# Patient Record
Sex: Female | Born: 1940
Health system: Southern US, Community
[De-identification: ages and names within clinical notes are randomized; demographics above are authoritative.]

## PROBLEM LIST (undated history)

## (undated) DIAGNOSIS — I10 Essential (primary) hypertension: Secondary | ICD-10-CM

## (undated) DIAGNOSIS — Z87442 Personal history of urinary calculi: Secondary | ICD-10-CM

## (undated) DIAGNOSIS — K635 Polyp of colon: Secondary | ICD-10-CM

## (undated) DIAGNOSIS — E119 Type 2 diabetes mellitus without complications: Secondary | ICD-10-CM

## (undated) DIAGNOSIS — R351 Nocturia: Secondary | ICD-10-CM

## (undated) DIAGNOSIS — M1990 Unspecified osteoarthritis, unspecified site: Secondary | ICD-10-CM

## (undated) HISTORY — PX: CHOLECYSTECTOMY: SHX55

## (undated) HISTORY — PX: ABDOMINAL HYSTERECTOMY: SHX81

## (undated) HISTORY — PX: OTHER SURGICAL HISTORY: SHX169

## (undated) HISTORY — DX: Polyp of colon: K63.5

## (undated) HISTORY — PX: TONSILLECTOMY: SUR1361

## (undated) HISTORY — PX: COLONOSCOPY: SHX174

## (undated) HISTORY — PX: JOINT REPLACEMENT: SHX530

---

## 2004-08-02 ENCOUNTER — Emergency Department: Payer: Self-pay | Admitting: Emergency Medicine

## 2005-11-25 ENCOUNTER — Ambulatory Visit: Payer: Self-pay

## 2005-12-29 ENCOUNTER — Ambulatory Visit: Payer: Self-pay

## 2006-01-06 ENCOUNTER — Emergency Department: Payer: Self-pay | Admitting: Internal Medicine

## 2006-05-11 ENCOUNTER — Ambulatory Visit: Payer: Self-pay | Admitting: General Surgery

## 2006-11-18 ENCOUNTER — Ambulatory Visit: Payer: Self-pay | Admitting: General Surgery

## 2006-12-20 ENCOUNTER — Emergency Department: Payer: Self-pay | Admitting: Emergency Medicine

## 2007-06-16 ENCOUNTER — Ambulatory Visit: Payer: Self-pay | Admitting: General Surgery

## 2007-08-12 DIAGNOSIS — K635 Polyp of colon: Secondary | ICD-10-CM

## 2007-08-12 HISTORY — DX: Polyp of colon: K63.5

## 2008-01-17 ENCOUNTER — Ambulatory Visit: Payer: Self-pay | Admitting: General Surgery

## 2008-08-08 ENCOUNTER — Emergency Department: Payer: Self-pay | Admitting: Internal Medicine

## 2008-12-27 ENCOUNTER — Ambulatory Visit: Payer: Self-pay | Admitting: Family Medicine

## 2009-11-19 ENCOUNTER — Ambulatory Visit: Payer: Self-pay | Admitting: Family Medicine

## 2009-12-29 ENCOUNTER — Emergency Department: Payer: Self-pay | Admitting: Emergency Medicine

## 2009-12-31 ENCOUNTER — Ambulatory Visit: Payer: Self-pay | Admitting: Family Medicine

## 2010-01-16 ENCOUNTER — Ambulatory Visit: Payer: Self-pay | Admitting: Family Medicine

## 2010-08-15 ENCOUNTER — Ambulatory Visit: Payer: Self-pay | Admitting: Family Medicine

## 2010-12-27 ENCOUNTER — Ambulatory Visit: Payer: Self-pay | Admitting: Physical Medicine and Rehabilitation

## 2011-04-30 ENCOUNTER — Ambulatory Visit: Payer: Self-pay | Admitting: Family Medicine

## 2011-05-17 ENCOUNTER — Emergency Department: Payer: Self-pay | Admitting: Emergency Medicine

## 2011-10-03 ENCOUNTER — Emergency Department (HOSPITAL_COMMUNITY)
Admission: EM | Admit: 2011-10-03 | Discharge: 2011-10-03 | Disposition: A | Discharge status: Home Health | Payer: Medicare Other | Attending: Emergency Medicine | Admitting: Emergency Medicine

## 2011-10-03 ENCOUNTER — Encounter (HOSPITAL_COMMUNITY): Payer: Self-pay

## 2011-10-03 ENCOUNTER — Emergency Department (HOSPITAL_COMMUNITY): Payer: Medicare Other

## 2011-10-03 DIAGNOSIS — E119 Type 2 diabetes mellitus without complications: Secondary | ICD-10-CM | POA: Insufficient documentation

## 2011-10-03 DIAGNOSIS — M79609 Pain in unspecified limb: Secondary | ICD-10-CM | POA: Diagnosis not present

## 2011-10-03 DIAGNOSIS — G8929 Other chronic pain: Secondary | ICD-10-CM | POA: Insufficient documentation

## 2011-10-03 DIAGNOSIS — I10 Essential (primary) hypertension: Secondary | ICD-10-CM | POA: Diagnosis not present

## 2011-10-03 DIAGNOSIS — M545 Low back pain, unspecified: Secondary | ICD-10-CM | POA: Insufficient documentation

## 2011-10-03 DIAGNOSIS — M549 Dorsalgia, unspecified: Secondary | ICD-10-CM

## 2011-10-03 DIAGNOSIS — I700 Atherosclerosis of aorta: Secondary | ICD-10-CM | POA: Diagnosis not present

## 2011-10-03 DIAGNOSIS — E7800 Pure hypercholesterolemia, unspecified: Secondary | ICD-10-CM | POA: Diagnosis not present

## 2011-10-03 DIAGNOSIS — M47817 Spondylosis without myelopathy or radiculopathy, lumbosacral region: Secondary | ICD-10-CM | POA: Diagnosis not present

## 2011-10-03 HISTORY — DX: Essential (primary) hypertension: I10

## 2011-10-03 MED ORDER — DIAZEPAM 2 MG PO TABS: 2.0000 mg | ORAL_TABLET | Freq: Two times a day (BID) | ORAL | Status: AC | PRN

## 2011-10-03 MED ORDER — HYDROCODONE-ACETAMINOPHEN 5-325 MG PO TABS: 1.0000 | ORAL_TABLET | Freq: Four times a day (QID) | ORAL | Status: AC | PRN

## 2011-10-03 NOTE — ED Notes (Signed)
Ambulated to the waiting room to await ride from her daughter.

## 2011-10-03 NOTE — ED Notes (Signed)
Patient transported to X-ray 

## 2011-10-03 NOTE — ED Notes (Addendum)
Pt c/o (L) lower back pain x1 day, increase pain w/left leg movement. Pt reports hx of chronic back pain, pt reports last flare up was July 2012. Pt describes her pain as tender and rates her pain 6/10 w/movement and 0/10 w/no movement. Pt denies any injury or numbness to lower extremities.

## 2011-10-03 NOTE — ED Notes (Signed)
MD at bedside. 

## 2011-10-03 NOTE — Discharge Instructions (Signed)
Please see your primary care provider for further management of your chronic back pain.  You may return to the ER at any time for worsening condition or any new symptoms that concern you.    Back Pain, Adult Low back pain is very common. About 1 in 5 people have back pain.The cause of low back pain is rarely dangerous. The pain often gets better over time.About half of people with a sudden onset of back pain feel better in just 2 weeks. About 8 in 10 people feel better by 6 weeks.  CAUSES Some common causes of back pain include:  Strain of the muscles or ligaments supporting the spine.   Wear and tear (degeneration) of the spinal discs.   Arthritis.   Direct injury to the back.  DIAGNOSIS Most of the time, the direct cause of low back pain is not known.However, back pain can be treated effectively even when the exact cause of the pain is unknown.Answering your caregiver's questions about your overall health and symptoms is one of the most accurate ways to make sure the cause of your pain is not dangerous. If your caregiver needs more information, he or she may order lab work or imaging tests (X-rays or MRIs).However, even if imaging tests show changes in your back, this usually does not require surgery. HOME CARE INSTRUCTIONS For many people, back pain returns.Since low back pain is rarely dangerous, it is often a condition that people can learn to [facility] their own.   Remain active. It is stressful on the back to sit or stand in one place. Do not sit, drive, or stand in one place for more than 30 minutes at a time. Take short walks on level surfaces as soon as pain allows.Try to increase the length of time you walk each day.   Do not stay in bed.Resting more than 1 or 2 days can delay your recovery.   Do not avoid exercise or work.Your body is made to move.It is not dangerous to be active, even though your back may hurt.Your back will likely heal faster if you return to being  active before your pain is gone.   Pay attention to your body when you bend and lift. Many people have less discomfortwhen lifting if they bend their knees, keep the load close to their bodies,and avoid twisting. Often, the most comfortable positions are those that put less stress on your recovering back.   Find a comfortable position to sleep. Use a firm mattress and lie on your side with your knees slightly bent. If you lie on your back, put a pillow under your knees.   Only take over-the-counter or prescription medicines as directed by your caregiver. Over-the-counter medicines to reduce pain and inflammation are often the most helpful.Your caregiver may prescribe muscle relaxant drugs.These medicines help dull your pain so you can more quickly return to your normal activities and healthy exercise.   Put ice on the injured area.   Put ice in a plastic bag.   Place a towel between your skin and the bag.   Leave the ice on for 15 to 20 minutes, 3 to 4 times a day for the first 2 to 3 days. After that, ice and heat may be alternated to reduce pain and spasms.   Ask your caregiver about trying back exercises and gentle massage. This may be of some benefit.   Avoid feeling anxious or stressed.Stress increases muscle tension and can worsen back pain.It is important to recognize  when you are anxious or stressed and learn ways to manage it.Exercise is a great option.  SEEK MEDICAL CARE IF:  You have pain that is not relieved with rest or medicine.   You have pain that does not improve in 1 week.   You have new symptoms.   You are generally not feeling well.  SEEK IMMEDIATE MEDICAL CARE IF:   You have pain that radiates from your back into your legs.   You develop new bowel or bladder control problems.   You have unusual weakness or numbness in your arms or legs.   You develop nausea or vomiting.   You develop abdominal pain.   You feel faint.  Document Released: 07/28/2005  Document Revised: 04/09/2011 Document Reviewed: 12/16/2010 [facility] Patient Information 2012 [place], [place].  RESOURCE GUIDE  Dental Problems  Patients with Medicaid: [facility] [phone]                                           [phone] [address] [employer] [address] Phone:  [phone]                                                  Phone:  [phone]  If unable to pay or uninsured, contact:  [facility] or [facility]. to become qualified for the adult dental clinic.  Chronic Pain Problems Contact [facility]  [phone] Patients need to be referred by their primary care doctor.  Insufficient Money for Medicine Contact United Way:  call "211" or [facility] 740-880-0738.  No Primary Care Doctor Call [facility]  [phone] Other agencies that provide inexpensive medical care    [facility]  [phone]    [facility]  [phone]    [facility]  [phone]    [facility]  [phone]    [facility]  [phone]    [facility]  [phone]  Psychological Services [facility]  [phone] [facility] Services  [phone] [facility] Mental Health   [phone] (emergency services ([phone])  Substance Abuse Resources [facility]  (201) [phone] [facility] (786)177-5740 [facility] [place] 602-665-0195 Floydene Flock (701)616-8700 Residential & Outpatient Substance Abuse Program  787-571-5688  Abuse/Neglect [facility] Child Abuse Hotline [phone] [facility] Child Abuse Hotline 203-533-1601 (After Hours)  Emergency Shelter [facility] [phone]  Maternity Homes [facility] at [facility] [place] [facility] [place] (408) [phone] [facility] [phone]  MRSA Hotline #:   (618)606-6453    [facility] Resources  [facility] [place]     United Way                           [facility] Dept. [address] [place]                       [address]      [address]  [address]  Sela Hua Phone:  [phone]                                   Phone:  [phone]                 Phone:  [place] Phone:  [place] [phone] (After Hours)

## 2011-10-03 NOTE — ED Provider Notes (Signed)
Medical screening examination/treatment/procedure(s) were conducted as a shared visit with non-physician practitioner(s) and myself.  I personally evaluated the patient during the encounter  Doug Sou, MD 10/03/11 1737

## 2011-10-03 NOTE — ED Provider Notes (Signed)
History     CSN: [id number]  Arrival date & time 10/03/11  4098   First MD Initiated Contact with Patient 10/03/11 [phone]      Chief Complaint  Patient presents with  . Back Pain    (Consider location/radiation/quality/duration/timing/severity/associated sxs/prior treatment) HPI Comments: The patient presents with back pain that started yesterday. She reports a gradual onset, and recalls she "wasn't doing anything" when the pain started. She reports the pain as localized to the left lower back. She says the pain is intermittent, achy and sometimes shoots down her left leg. The patient reports having a history of "back problems" since 2004 although she denies any past or recent trauma. She states the pain is exacerbated by ambulation, and she has not taken anything for the pain since it started yesterday. She denies any numbness or tingling, bowel/bladder incontinence, lower extremity weakness, or fever.   Patient is a 71 y.o. female presenting with back pain. The history is provided by the patient.  Back Pain  Pertinent negatives include no fever, no numbness, no abdominal pain, no dysuria and no weakness.    Past Medical History  Diagnosis Date  . Diabetes mellitus   . Hypertension   . High cholesterol     History reviewed. No pertinent past surgical history.  History reviewed. No pertinent family history.  History  Substance Use Topics  . Smoking status: Former Games developer  . Smokeless tobacco: Not on file  . Alcohol Use: No    OB History    Grav Para Term Preterm Abortions TAB SAB Ect Mult Living                  Review of Systems  Constitutional: Negative for fever.  Gastrointestinal: Negative for abdominal pain, diarrhea and constipation.  Genitourinary: Negative for dysuria, frequency and difficulty urinating.  Musculoskeletal: Positive for back pain.  Neurological: Negative for weakness and numbness.  All other systems reviewed and are negative.    Allergies    Review of patient's allergies indicates no known allergies.  Home Medications  No current outpatient prescriptions on file.  BP 145/89  Pulse 55  Temp(Src) 98 F (36.7 C) (Oral)  Resp 18  Ht 5\' 8"  (1.727 m)  Wt 259 lb (117.482 kg)  BMI 39.38 kg/m2  SpO2 99%  Physical Exam  Nursing note and vitals reviewed. Constitutional: She is oriented to person, place, and time. She appears well-developed and well-nourished.  HENT:  Head: Normocephalic and atraumatic.  Neck: Neck supple.  Cardiovascular: Normal rate, regular rhythm and normal heart sounds.   Pulmonary/Chest: Breath sounds normal. No respiratory distress. She has no wheezes. She has no rales. She exhibits no tenderness.  Abdominal: Soft. Bowel sounds are normal. There is no tenderness.  Musculoskeletal: Normal range of motion. She exhibits no edema and no tenderness.       Cervical back: She exhibits no tenderness and no bony tenderness.       Thoracic back: She exhibits no tenderness and no bony tenderness.       Lumbar back: She exhibits no tenderness and no bony tenderness.       Back:       Lower extremities: strength 5/5, sensation intact, distal pulses intact.   Neurological: She is alert and oriented to person, place, and time.  Psychiatric: She has a normal mood and affect.    ED Course  Procedures (including critical care time)  Labs Reviewed - No data to display Dg Lumbar Spine Complete  10/03/2011  *RADIOLOGY REPORT*  Clinical Data: No injury.  Chronic back pain.  LUMBAR SPINE - COMPLETE 4+ VIEW  Comparison: None.  Findings: Small to moderate Schmorl's node deformity superior plate L4 with anterior osteophyte.  Facet joint degenerative changes most notable L5-S1.  Aorta is partially calcified.  Caliber of the aorta incompletely assessed.  IMPRESSION: Small to moderate Schmorl's node deformity superior plate L4 with anterior osteophyte.  Facet joint degenerative changes most notable L5-S1.  Aorta is partially  calcified.  Caliber of the aorta incompletely assessed.  Original Report Authenticated By: Fuller Canada, M.D.   Patient discussed with Dr Ethelda Chick who has also seen the patient.   Patient is ambulatory without assistance.   1. Chronic back pain       MDM  Patient with exacerbation of chronic back pain without injury.  Xray showing schmorl's node and degenerative changes.  No neurological deficits.  Patient d/c home with pain medications, PCP (in [place]) follow up.  Patient verbalizes understanding and agrees with plan.          Dillard Cannon [place], [place] 10/03/11 [phone]

## 2011-10-03 NOTE — ED Provider Notes (Signed)
Complains of low back pain left sided paralumbar nonradiating for the past several days pain is worse with movement or changing position. Has had similar pain for several years. No treatment prior to coming here. On exam alert nontoxic abdomen obese nontender back no tenderness over spine paralumbar pain is exacerbated when patient stands up from a supine position gait is normal Pain felt to be musculoskeletal in etiology  Doug Sou, MD 10/03/11 1108

## 2011-10-03 NOTE — ED Notes (Signed)
Lower back pain denies any injury, denies any numbness or tingling of extremeties

## 2011-11-21 DIAGNOSIS — I10 Essential (primary) hypertension: Secondary | ICD-10-CM | POA: Diagnosis not present

## 2011-11-21 DIAGNOSIS — E119 Type 2 diabetes mellitus without complications: Secondary | ICD-10-CM | POA: Diagnosis not present

## 2011-11-21 DIAGNOSIS — E7800 Pure hypercholesterolemia, unspecified: Secondary | ICD-10-CM | POA: Diagnosis not present

## 2011-11-21 DIAGNOSIS — Z23 Encounter for immunization: Secondary | ICD-10-CM | POA: Diagnosis not present

## 2012-03-24 DIAGNOSIS — Z23 Encounter for immunization: Secondary | ICD-10-CM | POA: Diagnosis not present

## 2012-03-24 DIAGNOSIS — E119 Type 2 diabetes mellitus without complications: Secondary | ICD-10-CM | POA: Diagnosis not present

## 2012-03-24 DIAGNOSIS — Z0000 Encounter for general adult medical examination without abnormal findings: Secondary | ICD-10-CM | POA: Diagnosis not present

## 2012-03-24 DIAGNOSIS — I10 Essential (primary) hypertension: Secondary | ICD-10-CM | POA: Diagnosis not present

## 2012-03-24 DIAGNOSIS — E7800 Pure hypercholesterolemia, unspecified: Secondary | ICD-10-CM | POA: Diagnosis not present

## 2012-03-30 ENCOUNTER — Ambulatory Visit: Payer: Self-pay | Admitting: Family Medicine

## 2012-03-30 DIAGNOSIS — N951 Menopausal and female climacteric states: Secondary | ICD-10-CM | POA: Diagnosis not present

## 2012-03-30 LAB — HM DEXA SCAN: HM Dexa Scan: NORMAL

## 2012-05-04 DIAGNOSIS — E11329 Type 2 diabetes mellitus with mild nonproliferative diabetic retinopathy without macular edema: Secondary | ICD-10-CM | POA: Diagnosis not present

## 2012-05-04 DIAGNOSIS — IMO0001 Reserved for inherently not codable concepts without codable children: Secondary | ICD-10-CM | POA: Diagnosis not present

## 2012-07-21 DIAGNOSIS — Z23 Encounter for immunization: Secondary | ICD-10-CM | POA: Diagnosis not present

## 2012-07-21 DIAGNOSIS — E7800 Pure hypercholesterolemia, unspecified: Secondary | ICD-10-CM | POA: Diagnosis not present

## 2012-07-21 DIAGNOSIS — M545 Low back pain, unspecified: Secondary | ICD-10-CM | POA: Diagnosis not present

## 2012-07-21 DIAGNOSIS — E119 Type 2 diabetes mellitus without complications: Secondary | ICD-10-CM | POA: Diagnosis not present

## 2012-12-24 DIAGNOSIS — I10 Essential (primary) hypertension: Secondary | ICD-10-CM | POA: Diagnosis not present

## 2012-12-24 DIAGNOSIS — E119 Type 2 diabetes mellitus without complications: Secondary | ICD-10-CM | POA: Diagnosis not present

## 2012-12-24 DIAGNOSIS — Z23 Encounter for immunization: Secondary | ICD-10-CM | POA: Diagnosis not present

## 2012-12-24 DIAGNOSIS — E7800 Pure hypercholesterolemia, unspecified: Secondary | ICD-10-CM | POA: Diagnosis not present

## 2013-01-19 ENCOUNTER — Encounter: Payer: Self-pay | Admitting: General Surgery

## 2013-01-19 ENCOUNTER — Ambulatory Visit: Payer: Self-pay | Admitting: Family Medicine

## 2013-01-19 DIAGNOSIS — Z1231 Encounter for screening mammogram for malignant neoplasm of breast: Secondary | ICD-10-CM | POA: Diagnosis not present

## 2013-01-25 ENCOUNTER — Observation Stay (HOSPITAL_COMMUNITY)
Admission: EM | Admit: 2013-01-25 | Discharge: 2013-01-26 | Disposition: A | Discharge status: Home / Self Care | Payer: Medicare Other | Attending: Internal Medicine | Admitting: Internal Medicine

## 2013-01-25 ENCOUNTER — Encounter (HOSPITAL_COMMUNITY): Payer: Self-pay | Admitting: Cardiology

## 2013-01-25 ENCOUNTER — Emergency Department (HOSPITAL_COMMUNITY): Payer: Medicare Other

## 2013-01-25 DIAGNOSIS — F17200 Nicotine dependence, unspecified, uncomplicated: Secondary | ICD-10-CM | POA: Diagnosis not present

## 2013-01-25 DIAGNOSIS — R001 Bradycardia, unspecified: Secondary | ICD-10-CM | POA: Diagnosis present

## 2013-01-25 DIAGNOSIS — Z720 Tobacco use: Secondary | ICD-10-CM

## 2013-01-25 DIAGNOSIS — Z79899 Other long term (current) drug therapy: Secondary | ICD-10-CM | POA: Insufficient documentation

## 2013-01-25 DIAGNOSIS — G459 Transient cerebral ischemic attack, unspecified: Principal | ICD-10-CM | POA: Insufficient documentation

## 2013-01-25 DIAGNOSIS — N390 Urinary tract infection, site not specified: Secondary | ICD-10-CM | POA: Insufficient documentation

## 2013-01-25 DIAGNOSIS — R42 Dizziness and giddiness: Secondary | ICD-10-CM | POA: Diagnosis not present

## 2013-01-25 DIAGNOSIS — H538 Other visual disturbances: Secondary | ICD-10-CM | POA: Diagnosis not present

## 2013-01-25 DIAGNOSIS — E119 Type 2 diabetes mellitus without complications: Secondary | ICD-10-CM | POA: Insufficient documentation

## 2013-01-25 DIAGNOSIS — I498 Other specified cardiac arrhythmias: Secondary | ICD-10-CM | POA: Insufficient documentation

## 2013-01-25 DIAGNOSIS — I10 Essential (primary) hypertension: Secondary | ICD-10-CM | POA: Diagnosis not present

## 2013-01-25 DIAGNOSIS — N179 Acute kidney failure, unspecified: Secondary | ICD-10-CM | POA: Diagnosis not present

## 2013-01-25 DIAGNOSIS — S0990XA Unspecified injury of head, initial encounter: Secondary | ICD-10-CM | POA: Diagnosis not present

## 2013-01-25 DIAGNOSIS — F17201 Nicotine dependence, unspecified, in remission: Secondary | ICD-10-CM | POA: Diagnosis present

## 2013-01-25 DIAGNOSIS — Z8673 Personal history of transient ischemic attack (TIA), and cerebral infarction without residual deficits: Secondary | ICD-10-CM | POA: Diagnosis present

## 2013-01-25 LAB — POCT I-STAT, CHEM 8
BUN: 15 mg/dL (ref 6–23)
Calcium, Ion: 1.25 mmol/L (ref 1.13–1.30)
Creatinine, Ser: 1.4 mg/dL — ABNORMAL HIGH (ref 0.50–1.10)
Hemoglobin: 13.9 g/dL (ref 12.0–15.0)
Sodium: 146 mEq/L — ABNORMAL HIGH (ref 135–145)
TCO2: 27 mmol/L (ref 0–100)

## 2013-01-25 LAB — DIFFERENTIAL
Basophils Relative: 1 % (ref 0–1)
Eosinophils Absolute: 0.2 10*3/uL (ref 0.0–0.7)
Monocytes Absolute: 0.6 10*3/uL (ref 0.1–1.0)
Monocytes Relative: 9 % (ref 3–12)
Neutrophils Relative %: 47 % (ref 43–77)

## 2013-01-25 LAB — COMPREHENSIVE METABOLIC PANEL
Albumin: 3.7 g/dL (ref 3.5–5.2)
BUN: 15 mg/dL (ref 6–23)
Calcium: 10 mg/dL (ref 8.4–10.5)
Creatinine, Ser: 1.24 mg/dL — ABNORMAL HIGH (ref 0.50–1.10)
Total Protein: 7 g/dL (ref 6.0–8.3)

## 2013-01-25 LAB — RAPID URINE DRUG SCREEN, HOSP PERFORMED
Amphetamines: NOT DETECTED
Benzodiazepines: NOT DETECTED
Opiates: NOT DETECTED

## 2013-01-25 LAB — GLUCOSE, CAPILLARY: Glucose-Capillary: 119 mg/dL — ABNORMAL HIGH (ref 70–99)

## 2013-01-25 LAB — URINALYSIS, ROUTINE W REFLEX MICROSCOPIC
Glucose, UA: NEGATIVE mg/dL
Hgb urine dipstick: NEGATIVE
Ketones, ur: NEGATIVE mg/dL
Protein, ur: NEGATIVE mg/dL

## 2013-01-25 LAB — TROPONIN I: Troponin I: <0.3 ng/mL (ref <0.30)

## 2013-01-25 LAB — URINE MICROSCOPIC-ADD ON

## 2013-01-25 LAB — CBC
HCT: 41.3 % (ref 36.0–46.0)
Hemoglobin: 13.7 g/dL (ref 12.0–15.0)
MCH: 31.6 pg (ref 26.0–34.0)
MCHC: 33.2 g/dL (ref 30.0–36.0)

## 2013-01-25 LAB — PROTIME-INR: INR: 0.98 (ref 0.00–1.49)

## 2013-01-25 LAB — POCT I-STAT TROPONIN I

## 2013-01-25 MED ORDER — ASPIRIN 81 MG PO CHEW
324.0000 mg | CHEWABLE_TABLET | Freq: Once | ORAL | Status: AC
  Administered 2013-01-25: 324 mg via ORAL
  Filled 2013-01-25: qty 4

## 2013-01-25 MED ORDER — SODIUM CHLORIDE 0.9 % IV SOLN
INTRAVENOUS | Status: AC
  Administered 2013-01-25: 23:00:00 via INTRAVENOUS

## 2013-01-25 MED ORDER — ONDANSETRON HCL 4 MG/2ML IJ SOLN
4.0000 mg | Freq: Three times a day (TID) | INTRAMUSCULAR | Status: AC | PRN
  Administered 2013-01-25: 4 mg via INTRAVENOUS
  Filled 2013-01-25: qty 2

## 2013-01-25 MED ORDER — ACETAMINOPHEN 325 MG PO TABS: 650.0000 mg | ORAL_TABLET | ORAL | Status: DC | PRN

## 2013-01-25 MED ORDER — INSULIN ASPART 100 UNIT/ML ~~LOC~~ SOLN: 0.0000 [IU] | Freq: Every day | SUBCUTANEOUS | Status: DC

## 2013-01-25 MED ORDER — SIMVASTATIN 10 MG PO TABS
10.0000 mg | ORAL_TABLET | Freq: Every day | ORAL | Status: DC
  Filled 2013-01-25: qty 1

## 2013-01-25 MED ORDER — ASPIRIN 325 MG PO TABS
325.0000 mg | ORAL_TABLET | Freq: Every day | ORAL | Status: DC
  Administered 2013-01-26: 325 mg via ORAL
  Filled 2013-01-25 (×2): qty 1

## 2013-01-25 MED ORDER — DEXTROSE 5 % IV SOLN
1.0000 g | INTRAVENOUS | Status: DC
  Administered 2013-01-25: 1 g via INTRAVENOUS
  Filled 2013-01-25 (×2): qty 10

## 2013-01-25 MED ORDER — INSULIN ASPART 100 UNIT/ML ~~LOC~~ SOLN: 0.0000 [IU] | Freq: Three times a day (TID) | SUBCUTANEOUS | Status: DC

## 2013-01-25 NOTE — Progress Notes (Signed)
72 yr old F presents after episode of vision change and dizziness that is now resolved. Hypertensive in the 180's  admiited to tele for TIA workup

## 2013-01-25 NOTE — ED Provider Notes (Signed)
History     CSN: [id number]  Arrival date & time 01/25/13  1634   First MD Initiated Contact with Patient 01/25/13 1651      Chief Complaint  Patient presents with  . Dizziness    (Consider location/radiation/quality/duration/timing/severity/associated sxs/prior treatment) HPI Comments: Patient presents after episode of vision change and dizziness that is now resolved. He states he was getting in the car when she noticed her vision became bright and blurry and she was "unable to keep up with her feet". Vertigo in the past and states this is different. Her vision is back to normal now. She denies any focal weakness, numbness or tingling. Denies vertigo now.  C/o gradual onset headache since episode of dizziness and vision change.  Episode lasted 2-3 minutes. Feels back to baseline now. Denies chest pain or shortness of breath. She's a history of hypertension, high cholesterol diabetes. Denies any difficulty swallowing or talking.  The history is provided by the patient.    Past Medical History  Diagnosis Date  . Hypertension   . High cholesterol   . Diabetes mellitus     Past Surgical History  Procedure Laterality Date  . Abdominal hysterectomy    . Cholecystectomy    . Joint replacement Left     knee  . Goiter removal      Family History  Problem Relation Age of Onset  . Heart disease Mother   . Diabetes Mother   . Cancer Father     History  Substance Use Topics  . Smoking status: Current Every Day Smoker -- 0.50 packs/day    Types: Cigarettes  . Smokeless tobacco: Never Used  . Alcohol Use: No    OB History   Grav Para Term Preterm Abortions TAB SAB Ect Mult Living                  Review of Systems  Constitutional: Negative for fever, activity change and appetite change.  HENT: Negative for congestion, rhinorrhea and neck pain.   Eyes: Positive for visual disturbance.  Respiratory: Negative for cough, chest tightness and shortness of breath.    Cardiovascular: Negative for chest pain.  Gastrointestinal: Negative for nausea, vomiting and abdominal pain.  Genitourinary: Negative for dysuria, vaginal bleeding and vaginal discharge.  Musculoskeletal: Negative for back pain.  Skin: Negative for rash.  Neurological: Positive for dizziness, weakness, light-headedness and headaches. Negative for speech difficulty and numbness.  A complete 10 system review of systems was obtained and all systems are negative except as noted in the HPI and PMH.    Allergies  Review of patient's allergies indicates no known allergies.  Home Medications   No current outpatient prescriptions on file.  BP 136/84  Pulse 48  Temp(Src) 98 F (36.7 C) (Oral)  Resp 17  SpO2 100%  Physical Exam  Constitutional: She is oriented to person, place, and time. She appears well-developed and well-nourished. No distress.  HENT:  Head: Normocephalic and atraumatic.  Mouth/Throat: Oropharynx is clear and moist. No oropharyngeal exudate.  Eyes: Conjunctivae and EOM are normal. Pupils are equal, round, and reactive to light.  Neck: Normal range of motion. Neck supple.  Cardiovascular: Normal rate, regular rhythm and normal heart sounds.   No murmur heard. Pulmonary/Chest: Effort normal and breath sounds normal. No respiratory distress.  Abdominal: Soft. There is no tenderness. There is no rebound and no guarding.  Musculoskeletal: Normal range of motion. She exhibits no edema and no tenderness.  Neurological: She is alert and oriented  to person, place, and time. No cranial nerve deficit. She exhibits normal muscle tone. Coordination normal.  CN 2-12 intact, no ataxia on finger to nose, no nystagmus, 5/5 strength throughout, no pronator drift, Romberg negative, normal gait.   Skin: Skin is warm.    ED Course  Procedures (including critical care time)  Labs Reviewed  COMPREHENSIVE METABOLIC PANEL - Abnormal; Notable for the following:    Glucose, Bld 105 (*)     Creatinine, Ser 1.24 (*)    GFR calc non Af Amer 42 (*)    GFR calc Af Amer 49 (*)    All other components within normal limits  URINALYSIS, ROUTINE W REFLEX MICROSCOPIC - Abnormal; Notable for the following:    Leukocytes, UA MODERATE (*)    All other components within normal limits  GLUCOSE, CAPILLARY - Abnormal; Notable for the following:    Glucose-Capillary 119 (*)    All other components within normal limits  URINE MICROSCOPIC-ADD ON - Abnormal; Notable for the following:    Squamous Epithelial / LPF FEW (*)    Bacteria, UA FEW (*)    Casts HYALINE CASTS (*)    All other components within normal limits  GLUCOSE, CAPILLARY - Abnormal; Notable for the following:    Glucose-Capillary 103 (*)    All other components within normal limits  POCT I-STAT, CHEM 8 - Abnormal; Notable for the following:    Sodium 146 (*)    Creatinine, Ser 1.40 (*)    Glucose, Bld 101 (*)    All other components within normal limits  URINE CULTURE  ETHANOL  PROTIME-INR  APTT  CBC  DIFFERENTIAL  TROPONIN I  URINE RAPID DRUG SCREEN (HOSP PERFORMED)  HEMOGLOBIN A1C  LIPID PANEL  LIPID PANEL  SODIUM, URINE, RANDOM  CREATININE, URINE, RANDOM  TROPONIN I  TROPONIN I  TROPONIN I  POCT I-STAT TROPONIN I   Ct Head Wo Contrast  01/25/2013   *RADIOLOGY REPORT*  Clinical Data: Fall, dizziness  CT HEAD WITHOUT CONTRAST  Technique:  Contiguous axial images were obtained from the base of the skull through the vertex without contrast.  Comparison: None.  Findings: No acute intracranial hemorrhage.  No focal mass lesion. No CT evidence of acute infarction.   No midline shift or mass effect.  No hydrocephalus.  Basilar cisterns are patent. Paranasal sinuses and mastoid air cells are clear.  Orbits are normal.  IMPRESSION: No acute intracranial findings.   Original Report Authenticated By: Genevive Bi, M.D.     1. TIA (transient ischemic attack)   2. Acute renal failure   3. DM type 2 (diabetes  mellitus, type 2)   4. HTN (hypertension)       MDM  Episode of vision change and dizziness that is now resolved. Nonfocal neuro exam. Concern for TIA.  Nonfocal neuro exam on arrival. No focal deficits. CT head negative for acute infarct.  Second episode of vision change with dizziness and CT scan result after a few minutes. Denies vertigo.  Concern for TIA. Patient with risk factors of diabetes, hypertension, tobacco abuse, cholesterol. Will admit for workup.   Date: 01/25/2013  Rate: 58  Rhythm: normal sinus rhythm  QRS Axis: normal  Intervals: PR prolonged  ST/T Wave abnormalities: normal  Conduction Disutrbances:first-degree A-V block   Narrative Interpretation:   Old EKG Reviewed: none available         Glynn Octave, MD 01/25/13 2243

## 2013-01-25 NOTE — H&P (Signed)
PCP: MALONEY,NANCY, MD    Chief Complaint:  Blurred vision  HPI: Suzanne Faulkner is a 72 y.o. female   has a past medical history of Hypertension; High cholesterol; and Diabetes mellitus.   Presented with  At 4 Pm Patient reports an episode of "abnormal vision" everything went bright and suddenly everything looked like it was further away then it was this lasted less than 5 min. She also reports not feeling well but could not explain in what way. She developed a headache in her temples bilaterally after that. Once she arrived to ER she has an other very short lived episode after which her headache has resolved. Denies slurred speech, denies syncope or vertigo or presyncope.  Patient has risk factors of DM, HTN and tobacco abuse. Hospitalist was called for an admission. Now her symptoms have completely resolved.  denies any urinary complaints although UA showed mild UTI.  States she ahs been bradycardic for the past 10 year and at first she was told she needs a pacemaker  but then since she was not symptomatic this was no longer discussed.   Review of Systems:    Pertinent positives include: headaches, fatigue,  Constitutional:  No weight loss, night sweats, Fevers, chills,  weight loss  HEENT:  No Difficulty swallowing,Tooth/dental problems,Sore throat,  No sneezing, itching, ear ache, nasal congestion, post nasal drip,  Cardio-vascular:  No chest pain, Orthopnea, PND, anasarca, dizziness, palpitations.no Bilateral lower extremity swelling  GI:  No heartburn, indigestion, abdominal pain, nausea, vomiting, diarrhea, change in bowel habits, loss of appetite, melena, blood in stool, hematemesis Resp:  no shortness of breath at rest. No dyspnea on exertion, No excess mucus, no productive cough, No non-productive cough, No coughing up of blood.No change in color of mucus.No wheezing. Skin:  no rash or lesions. No jaundice GU:  no dysuria, change in color of urine, no urgency or frequency.  No straining to urinate.  No flank pain.  Musculoskeletal:  No joint pain or no joint swelling. No decreased range of motion. No back pain.  Psych:  No change in mood or affect. No depression or anxiety. No memory loss.  Neuro: no localizing neurological complaints, no tingling, no weakness, no double vision, no gait abnormality, no slurred speech, no confusion  Otherwise ROS are negative except for above, 10 systems were reviewed  Past Medical History: Past Medical History  Diagnosis Date  . Hypertension   . High cholesterol   . Diabetes mellitus    Past Surgical History  Procedure Laterality Date  . Abdominal hysterectomy    . Cholecystectomy    . Joint replacement Left     knee  . Goiter removal       Medications: Prior to Admission medications   Medication Sig Start Date End Date Taking? Authorizing Provider  Ascorbic Acid (VITAMIN C PO) Take 1 tablet by mouth every morning.   Yes Historical Provider, MD  aspirin EC 81 MG tablet Take 81 mg by mouth every morning.    Yes Historical Provider, MD  b complex vitamins tablet Take 1 tablet by mouth every morning.    Yes Historical Provider, MD  benazepril (LOTENSIN) 20 MG tablet Take 20 mg by mouth every morning.    Yes Historical Provider, MD  Calcium Carbonate-Vitamin D (CALCIUM + D PO) Take 1 tablet by mouth every morning.    Yes Historical Provider, MD  cholecalciferol (VITAMIN D) 1000 UNITS tablet Take 1,000 Units by mouth every morning.    Yes Historical Provider,  MD  hydrochlorothiazide (MICROZIDE) 12.5 MG capsule Take 12.5 mg by mouth every morning.    Yes Historical Provider, MD  lovastatin (MEVACOR) 20 MG tablet Take 20 mg by mouth at bedtime.   Yes Historical Provider, MD  metFORMIN (GLUCOPHAGE) 500 MG tablet Take 500 mg by mouth 2 (two) times daily with a meal.   Yes Historical Provider, MD    Allergies:  No Known Allergies  Social History:  Ambulatory  independently   Lives at   Home with family   reports  that she has been smoking Cigarettes.  She has been smoking about 0.50 packs per day. She has never used smokeless tobacco. She reports that she does not drink alcohol or use illicit drugs.   Family History: family history includes Cancer in her father; Diabetes in her mother; and Heart disease in her mother.    Physical Exam: Patient Vitals for the past 24 hrs:  BP Temp Temp src Pulse Resp SpO2  01/25/13 1918 136/84 mmHg 98 F (36.7 C) Oral 48 17 100 %  01/25/13 1749 - 98 F (36.7 C) - - - -  01/25/13 1649 183/96 mmHg 98.2 F (36.8 C) Oral 53 14 98 %  01/25/13 1638 163/86 mmHg 98.4 F (36.9 C) Oral 58 16 98 %    1. General:  in No Acute distress 2. Psychological: Alert and  Oriented 3. Head/ENT:   Moist  Mucous Membranes                          Head Non traumatic, neck supple                          Normal   Dentition 4. SKIN: normal  Skin turgor,  Skin clean Dry and intact no rash 5. Heart: Regular rate and rhythm no Murmur, Rub or gallop 6. Lungs: Clear to auscultation bilaterally, no wheezes or crackles   7. Abdomen: Soft, non-tender, Non distended, obese 8. Lower extremities: no clubbing, cyanosis, or edema 9. Neurologically strength 5/5 in all 4 ext CN 2-12 intact 10. MSK: Normal range of motion  body mass index is unknown because there is no weight on file.   Labs on Admission:   Recent Labs  01/25/13 1738 01/25/13 1811  NA 145 146*  K 4.4 4.4  CL 110 109  CO2 28  --   GLUCOSE 105* 101*  BUN 15 15  CREATININE 1.24* 1.40*  CALCIUM 10.0  --     Recent Labs  01/25/13 1738  AST 21  ALT 9  ALKPHOS 91  BILITOT 0.3  PROT 7.0  ALBUMIN 3.7   No results found for this basename: LIPASE, AMYLASE,  in the last 72 hours  Recent Labs  01/25/13 1738 01/25/13 1811  WBC 6.4  --   NEUTROABS 3.0  --   HGB 13.7 13.9  HCT 41.3 41.0  MCV 95.2  --   PLT 194  --     Recent Labs  01/25/13 1739  TROPONINI <0.30   No results found for this basename: TSH,  T4TOTAL, FREET3, T3FREE, THYROIDAB,  in the last 72 hours No results found for this basename: VITAMINB12, FOLATE, FERRITIN, TIBC, IRON, RETICCTPCT,  in the last 72 hours No results found for this basename: HGBA1C    The CrCl is unknown because both a height and weight (above a minimum accepted value) are required for this calculation. ABG  Component Value Date/Time   TCO2 27 01/25/2013 1811     No results found for this basename: DDIMER     Other results:  I have pearsonaly reviewed this: ECG REPORT  Rate: 58  Rhythm: Sinus brady ST&T Change: no ischemic changes  UA  7-10 WBC few bacteria   Cultures: No results found for this basename: sdes, specrequest, cult, reptstatus       Radiological Exams on Admission: Ct Head Wo Contrast  01/25/2013   *RADIOLOGY REPORT*  Clinical Data: Fall, dizziness  CT HEAD WITHOUT CONTRAST  Technique:  Contiguous axial images were obtained from the base of the skull through the vertex without contrast.  Comparison: None.  Findings: No acute intracranial hemorrhage.  No focal mass lesion. No CT evidence of acute infarction.   No midline shift or mass effect.  No hydrocephalus.  Basilar cisterns are patent. Paranasal sinuses and mastoid air cells are clear.  Orbits are normal.  IMPRESSION: No acute intracranial findings.   Original Report Authenticated By: Genevive Bi, M.D.    Chart has been reviewed  Assessment/Plan  72 yo F with hx of DM, HTN here with possible TIA symptoms  Present on Admission:  . TIA (transient ischemic attack)  - will admit based on TIA/CVA protocol, await results of MRA/MRI, Carotid Doppler and Echo, obtain cardiac enzymes,  ECG,  Lipid panel, TSH. Order PT/OT evaluation. Will make sure patient is on antiplatelet agent.   Neurology consult if evidence of CVA on MRI.     Marland Kitchen HTN (hypertension) - hold lisinopril given possible Acute renal falilure . Tobacco abuse - spoke about quiting . DM type 2 (diabetes mellitus,  type 2) - SSI hold metformin . Bradycardia - chronic . Acute renal failure - give gentle IVF unsure what is her baseline, FeNa, urine electrolytes   Prophylaxis: SCD    CODE STATUS: FULL CODE  Other plan as per orders.  I have spent a total of 55 min on this admission  Clarann Helvey 01/25/2013, 7:48 PM

## 2013-01-25 NOTE — ED Notes (Addendum)
Pt reports getting out of her car this afternoon and getting dizzy. States that she had blurry vision and dizziness that lasted a couple of minutes. States that she developed a headache soon after. Denies dizziness and blurry vision, but reports headache is 5/10 at this time. Pt neuro intact. A&O x 4. Pt is sinus brady at this time and reports a history of sinus brady.

## 2013-01-25 NOTE — ED Notes (Signed)
Pt reports that this afternoon she started feeling dizzy and states that she felt like she "could not keep up with her feet". No neuro deficits noted and denies any weakness. Reports a slight headache. Pt A&Ox4.

## 2013-01-25 NOTE — ED Notes (Signed)
Patient transported to X-ray 

## 2013-01-25 NOTE — Progress Notes (Signed)
Unit CM UR Completed by [facility] ED CM  W. Nakema Fake RN  

## 2013-01-25 NOTE — ED Notes (Signed)
Report attempted 

## 2013-01-26 ENCOUNTER — Observation Stay (HOSPITAL_COMMUNITY): Payer: Medicare Other

## 2013-01-26 DIAGNOSIS — G459 Transient cerebral ischemic attack, unspecified: Secondary | ICD-10-CM | POA: Diagnosis not present

## 2013-01-26 DIAGNOSIS — I658 Occlusion and stenosis of other precerebral arteries: Secondary | ICD-10-CM | POA: Diagnosis not present

## 2013-01-26 DIAGNOSIS — I6509 Occlusion and stenosis of unspecified vertebral artery: Secondary | ICD-10-CM | POA: Diagnosis not present

## 2013-01-26 DIAGNOSIS — I10 Essential (primary) hypertension: Secondary | ICD-10-CM

## 2013-01-26 DIAGNOSIS — R51 Headache: Secondary | ICD-10-CM | POA: Diagnosis not present

## 2013-01-26 DIAGNOSIS — N179 Acute kidney failure, unspecified: Secondary | ICD-10-CM | POA: Diagnosis not present

## 2013-01-26 DIAGNOSIS — I517 Cardiomegaly: Secondary | ICD-10-CM

## 2013-01-26 DIAGNOSIS — I498 Other specified cardiac arrhythmias: Secondary | ICD-10-CM

## 2013-01-26 LAB — GLUCOSE, CAPILLARY: Glucose-Capillary: 94 mg/dL (ref 70–99)

## 2013-01-26 LAB — LIPID PANEL
Cholesterol: 145 mg/dL (ref 0–200)
HDL: 50 mg/dL (ref >39)
LDL Cholesterol: 70 mg/dL (ref 0–99)
Triglycerides: 123 mg/dL (ref <150)

## 2013-01-26 LAB — BASIC METABOLIC PANEL
Calcium: 9 mg/dL (ref 8.4–10.5)
Chloride: 105 mEq/L (ref 96–112)
Creatinine, Ser: 1.38 mg/dL — ABNORMAL HIGH (ref 0.50–1.10)
GFR calc Af Amer: 43 mL/min — ABNORMAL LOW (ref >90)

## 2013-01-26 LAB — HEMOGLOBIN A1C
Hgb A1c MFr Bld: 5.4 % (ref <5.7)
Mean Plasma Glucose: 108 mg/dL (ref <117)

## 2013-01-26 LAB — TROPONIN I
Troponin I: <0.3 ng/mL (ref <0.30)
Troponin I: <0.3 ng/mL (ref <0.30)

## 2013-01-26 LAB — SODIUM, URINE, RANDOM: Sodium, Ur: 206 mEq/L

## 2013-01-26 MED ORDER — AMLODIPINE BESYLATE 5 MG PO TABS
5.0000 mg | ORAL_TABLET | Freq: Every day | ORAL | Status: DC
  Filled 2013-01-26: qty 1

## 2013-01-26 MED ORDER — CIPROFLOXACIN HCL 250 MG PO TABS: 250.0000 mg | ORAL_TABLET | Freq: Two times a day (BID) | ORAL | Status: DC

## 2013-01-26 MED ORDER — AMLODIPINE BESYLATE 5 MG PO TABS: 5.0000 mg | ORAL_TABLET | Freq: Every day | ORAL | Status: DC

## 2013-01-26 MED ORDER — ASPIRIN 325 MG PO TABS: 325.0000 mg | ORAL_TABLET | Freq: Every day | ORAL | Status: DC

## 2013-01-26 NOTE — Progress Notes (Signed)
D/C instructions were reviewed with pt and her daughter. Stroke education reviewed with pt, handout booklet given to pt. (pt aware her MRI/MRA resulted negative for stroke.) pt educated on stroke signs and symptoms if she were to have any and to call 911. See education flowsheet.  Pt was given copy of instructions, stroke handouts and scripts. Pt d/c'd via wheelchair with belongings, escorted by unit NT.

## 2013-01-26 NOTE — Progress Notes (Signed)
PT Note:  PT screen performed, pt independent, no PT or OT needs at this time.  Lyanne Co, PT  Ac[facility] Re[facility] Services  [phone]

## 2013-01-26 NOTE — Discharge Summary (Signed)
Physician Discharge Summary  Patient ID: Suzanne Faulkner MRN: [id number] DOB/AGE: October 04, 1940 72 y.o.  Admit date: 01/25/2013 Discharge date: 01/26/2013  Primary Care Physician:  Lorie Phenix, MD  Discharge Diagnoses:    . TIA (transient ischemic attack) . Tobacco abuse . DM type 2 (diabetes mellitus, type 2) . Bradycardia . Acute renal failure . UTI (lower urinary tract infection) . Accelerated hypertension  Consults: None   Recommendations for Outpatient Follow-up:  Lisinopril/ HCTZ was stopped during hospitalization due to acute renal sufficiency at the time of admission. Please check the labs BMET at the time of follow-up.   Allergies:  No Known Allergies   Discharge Medications:   Medication List    TAKE these medications       aspirin 325 MG tablet  Take 1 tablet (325 mg total) by mouth daily.     b complex vitamins tablet  Take 1 tablet by mouth every morning.     benazepril 20 MG tablet  Commonly known as:  LOTENSIN  Take 20 mg by mouth every morning.     CALCIUM + D PO  Take 1 tablet by mouth every morning.     cholecalciferol 1000 UNITS tablet  Commonly known as:  VITAMIN D  Take 1,000 Units by mouth every morning.     ciprofloxacin 250 MG tablet  Commonly known as:  CIPRO  Take 1 tablet (250 mg total) by mouth 2 (two) times daily. X 3 days     hydrochlorothiazide 12.5 MG capsule  Commonly known as:  MICROZIDE  Take 12.5 mg by mouth every morning.     lovastatin 20 MG tablet  Commonly known as:  MEVACOR  Take 20 mg by mouth at bedtime.     metFORMIN 500 MG tablet  Commonly known as:  GLUCOPHAGE  Take 500 mg by mouth 2 (two) times daily with a meal.     VITAMIN C PO  Take 1 tablet by mouth every morning.      ASK your doctor about these medications       aspirin EC 81 MG tablet  Take 81 mg by mouth every morning.         Brief H and P: For complete details please refer to admission H and P, but in brief patient presented with  episode of "abnormal vision" around 2 PM, everything went bright and suddenly everything looked like it was further away then it was this lasted less than 5 min. She also reports not feeling well but could not explain in what way. She developed a headache in her temples bilaterally after that. Once she arrived to ER she has an other very short lived episode after which her headache has resolved. Denies slurred speech, denies syncope or vertigo or presyncope.  Patient has risk factors of DM, HTN and tobacco abuse. Hospitalist was called for an admission. BP was elevated at 183/96 Denied any urinary complaints although UA showed mild UTI.  States she ahs been bradycardic for the past 10 year and at first she was told she needs a pacemaker but then since she was not symptomatic this was no longer discussed   Hospital Course:     TIA (transient ischemic attack): Symptoms could be due to accelerated hypertension. Symptoms have resolved, patient had no further TIA symptoms during the hospitalization. Patient had full TIA workup including MRI/MRA brain which did not show any acute infarct. Carotid Dopplers showed less than 39% ICA stenosis. Patient was placed on aspirin 325 mg daily (she  was on aspirin 81 mg daily prior to the admission) and lovastatin. 2-D echo showed EF of 55-60%, grade 1 diastolic dysfunction, no regional wall motion abnormality.    Tobacco abuse: Patient was strongly counseled for smoking cessation    DM type 2 (diabetes mellitus, type 2) - Patient to continue oral hypoglycemics after discharge    Bradycardia: Heart rate was in the 50s to 60s, asymptomatic during hospitalization    Acute renal failure: Creatinine was 1.4 at the time of admission, patient was gently hydrated with IV fluids, improved to 1.3 at discharge.    UTI (lower urinary tract infection) with no symptoms - Patient was placed on IV Rocephin, urine culture still pending, placed on oral ciprofloxacin for 3 days     Accelerated hypertension: Now better controlled, continue lisinopril-HCTZ.  Day of Discharge BP 162/89  Pulse 58  Temp(Src) 97.9 F (36.6 C) (Oral)  Resp 17  SpO2 100%  Physical Exam: General: Alert and awake oriented x3 not in any acute distress. HEENT: anicteric sclera, pupils reactive to light and accommodation CVS: S1-S2 clear no murmur rubs or gallops Chest: clear to auscultation bilaterally, no wheezing rales or rhonchi Abdomen: soft nontender, nondistended, normal bowel sounds, no organomegaly Extremities: no cyanosis, clubbing or edema noted bilaterally Neuro: Cranial nerves II-XII intact, no focal neurological deficits   The results of significant diagnostics from this hospitalization (including imaging, microbiology, ancillary and laboratory) are listed below for reference.    LAB RESULTS: Basic Metabolic Panel:  Recent Labs Lab 01/25/13 1738 01/25/13 1811  NA 145 146*  K 4.4 4.4  CL 110 109  CO2 28  --   GLUCOSE 105* 101*  BUN 15 15  CREATININE 1.24* 1.40*  CALCIUM 10.0  --    Liver Function Tests:  Recent Labs Lab 01/25/13 1738  AST 21  ALT 9  ALKPHOS 91  BILITOT 0.3  PROT 7.0  ALBUMIN 3.7   No results found for this basename: LIPASE, AMYLASE,  in the last 168 hours No results found for this basename: AMMONIA,  in the last 168 hours CBC:  Recent Labs Lab 01/25/13 1738 01/25/13 1811  WBC 6.4  --   NEUTROABS 3.0  --   HGB 13.7 13.9  HCT 41.3 41.0  MCV 95.2  --   PLT 194  --    Cardiac Enzymes:  Recent Labs Lab 01/26/13 0543 01/26/13 1125  TROPONINI <0.30 <0.30   BNP: No components found with this basename: POCBNP,  CBG:  Recent Labs Lab 01/26/13 0729 01/26/13 1117  GLUCAP 85 100*    Significant Diagnostic Studies:  Ct Head Wo Contrast  01/25/2013   *RADIOLOGY REPORT*  Clinical Data: Fall, dizziness  CT HEAD WITHOUT CONTRAST  Technique:  Contiguous axial images were obtained from the base of the skull through the vertex  without contrast.  Comparison: None.  Findings: No acute intracranial hemorrhage.  No focal mass lesion. No CT evidence of acute infarction.   No midline shift or mass effect.  No hydrocephalus.  Basilar cisterns are patent. Paranasal sinuses and mastoid air cells are clear.  Orbits are normal.  IMPRESSION: No acute intracranial findings.   Original Report Authenticated By: Genevive Bi, M.D.   Mri Brain Without Contrast  01/26/2013   *RADIOLOGY REPORT*  Clinical Data:  Diabetic hypertensive patient with hypercholesterolemia presenting with episode of abnormal vision for 5 minutes followed by headache.  MRI BRAIN WITHOUT CONTRAST MRA HEAD WITHOUT CONTRAST  Technique: Multiplanar, multiecho pulse sequences of the brain  and surrounding structures were obtained according to standard protocol without intravenous contrast.  Angiographic images of the head were obtained using MRA technique without contrast.  Comparison: 01/25/2013 head CT.  MRI HEAD  Findings:  No acute infarct.  Mild small vessel disease type changes.  No hydrocephalus.  No intracranial mass lesion detected on this unenhanced exam.  No intracranial hemorrhage.  Cervical medullary junction, pituitary region, pineal region and orbital structures unremarkable.  IMPRESSION: No acute infarct.  Please see above.  MRA HEAD  Findings: Mild narrowing left internal carotid artery cavernous segment.  Anterior circulation without medium or large size vessel significant stenosis or occlusion otherwise noted.  Fetal type origin of the posterior cerebral arteries.  Moderate tandem stenoses right vertebral artery most notable after the takeoff of the poorly delineated right PICA.  Ectatic left vertebral artery without high-grade stenosis. Nonvisualization left PICA.  Mild irregularity and slight narrowing of the basilar artery.  Nonvisualization AICAs.  Moderate irregularity and narrowing of the superior cerebellar artery with poorly delineated left superior  cerebellar artery.  Posterior cerebral artery branch vessel irregularity and narrowing bilaterally.  No aneurysm or vascular malformation detected.  IMPRESSION: Intracranial atherosclerotic type changes more notable in the posterior circulation as detailed above.   Original Report Authenticated By: Lacy Duverney, M.D.   Mr Mra Head/brain Wo Cm  01/26/2013   *RADIOLOGY REPORT*  Clinical Data:  Diabetic hypertensive patient with hypercholesterolemia presenting with episode of abnormal vision for 5 minutes followed by headache.  MRI BRAIN WITHOUT CONTRAST MRA HEAD WITHOUT CONTRAST  Technique: Multiplanar, multiecho pulse sequences of the brain and surrounding structures were obtained according to standard protocol without intravenous contrast.  Angiographic images of the head were obtained using MRA technique without contrast.  Comparison: 01/25/2013 head CT.  MRI HEAD  Findings:  No acute infarct.  Mild small vessel disease type changes.  No hydrocephalus.  No intracranial mass lesion detected on this unenhanced exam.  No intracranial hemorrhage.  Cervical medullary junction, pituitary region, pineal region and orbital structures unremarkable.  IMPRESSION: No acute infarct.  Please see above.  MRA HEAD  Findings: Mild narrowing left internal carotid artery cavernous segment.  Anterior circulation without medium or large size vessel significant stenosis or occlusion otherwise noted.  Fetal type origin of the posterior cerebral arteries.  Moderate tandem stenoses right vertebral artery most notable after the takeoff of the poorly delineated right PICA.  Ectatic left vertebral artery without high-grade stenosis. Nonvisualization left PICA.  Mild irregularity and slight narrowing of the basilar artery.  Nonvisualization AICAs.  Moderate irregularity and narrowing of the superior cerebellar artery with poorly delineated left superior cerebellar artery.  Posterior cerebral artery branch vessel irregularity and narrowing  bilaterally.  No aneurysm or vascular malformation detected.  IMPRESSION: Intracranial atherosclerotic type changes more notable in the posterior circulation as detailed above.   Original Report Authenticated By: Lacy Duverney, M.D.    2D ECHO: Study Conclusions  - Left ventricle: The cavity size was normal. Wall thickness was increased in a pattern of mild LVH. Systolic function was normal. The estimated ejection fraction was in the range of 55% to 60%. Wall motion was normal; there were no regional wall motion abnormalities. Doppler parameters are consistent with abnormal left ventricular relaxation (grade 1 diastolic dysfunction).    Disposition and Follow-up: Discharge Orders   Future Appointments Provider Department Dept Phone   02/01/2013 2:45 PM Earline Mayotte, MD [facility] [phone]   Future Orders Complete By Expires  Diet Carb Modified  As directed     Increase activity slowly  As directed         DISPOSITION: Home DIET: Carb modified diet ACTIVITY: As tolerated TESTS THAT NEED FOLLOW-UP BMET at follow-up  DISCHARGE FOLLOW-UP Follow-up Information   Follow up with MALONEY,NANCY, MD. Schedule an appointment as soon as possible for a visit in 10 days.   Contact information:   [address] [place] [address] [phone]       Time spent on Discharge: 40 mins  Signed:   Amiley Shishido M.D. [facility] 01/26/2013, 3:25 PM Pager: [phone]

## 2013-01-26 NOTE — Progress Notes (Signed)
*  PRELIMINARY RESULTS* Vascular Ultrasound Carotid Duplex (Doppler) has been completed.  Preliminary findings: Bilateral:  Less than 39% ICA stenosis.  Vertebral artery flow is antegrade.      Farrel Demark, RDMS, RVT   01/26/2013, 11:57 AM

## 2013-01-26 NOTE — Progress Notes (Signed)
Occupational Therapy Note   OT observed pt ambulating in hallway with PT. PT noting that pt has no acute OT needs. Per chart review, symptoms have resolved. Will sign off at this time.  01/26/2013 Cipriano Mile OTR/L Pager [phone] Office [phone]

## 2013-01-26 NOTE — Progress Notes (Signed)
  Echocardiogram 2D Echocardiogram has been performed.  Ilissa Rosner FRANCES 01/26/2013, 12:38 PM

## 2013-01-26 NOTE — Progress Notes (Signed)
D/c instructions to be reviewed with pt. Pt had negative MRI/MRA for stroke. Pt provided education of stroke at time of discharge although scans were negative for stroke.

## 2013-01-27 LAB — URINE CULTURE

## 2013-01-31 DIAGNOSIS — Z23 Encounter for immunization: Secondary | ICD-10-CM | POA: Diagnosis not present

## 2013-01-31 DIAGNOSIS — I10 Essential (primary) hypertension: Secondary | ICD-10-CM | POA: Diagnosis not present

## 2013-01-31 DIAGNOSIS — G459 Transient cerebral ischemic attack, unspecified: Secondary | ICD-10-CM | POA: Diagnosis not present

## 2013-01-31 DIAGNOSIS — E7800 Pure hypercholesterolemia, unspecified: Secondary | ICD-10-CM | POA: Diagnosis not present

## 2013-02-01 ENCOUNTER — Ambulatory Visit (INDEPENDENT_AMBULATORY_CARE_PROVIDER_SITE_OTHER): Payer: Medicare Other | Admitting: General Surgery

## 2013-02-01 ENCOUNTER — Encounter: Payer: Self-pay | Admitting: General Surgery

## 2013-02-01 VITALS — BP 140/78 | HR 74 | Resp 14 | Ht 66.0 in | Wt 264.0 lb

## 2013-02-01 DIAGNOSIS — Z86010 Personal history of colonic polyps: Secondary | ICD-10-CM | POA: Diagnosis not present

## 2013-02-01 DIAGNOSIS — Z1211 Encounter for screening for malignant neoplasm of colon: Secondary | ICD-10-CM | POA: Diagnosis not present

## 2013-02-01 MED ORDER — POLYETHYLENE GLYCOL 3350 17 GM/SCOOP PO POWD: ORAL | Status: DC

## 2013-02-01 NOTE — Patient Instructions (Addendum)
   Colonoscopy A colonoscopy is an exam to evaluate your entire colon. In this exam, your colon is cleansed. A long fiberoptic tube is inserted through your rectum and into your colon. The fiberoptic scope (endoscope) is a long bundle of enclosed and very flexible fibers. These fibers transmit light to the area examined and send images from that area to your caregiver. Discomfort is usually minimal. You may be given a drug to help you sleep (sedative) during or prior to the procedure. This exam helps to detect lumps (tumors), polyps, inflammation, and areas of bleeding. Your caregiver may also take a small piece of tissue (biopsy) that will be examined under a microscope. LET YOUR CAREGIVER KNOW ABOUT:   Allergies to food or medicine.  Medicines taken, including vitamins, herbs, eyedrops, over-the-counter medicines, and creams.  Use of steroids (by mouth or creams).  Previous problems with anesthetics or numbing medicines.  History of bleeding problems or blood clots.  Previous surgery.  Other health problems, including diabetes and kidney problems.  Possibility of pregnancy, if this applies. BEFORE THE PROCEDURE   A clear liquid diet may be required for 2 days before the exam.  Ask your caregiver about changing or stopping your regular medications.  Liquid injections (enemas) or laxatives may be required.  A large amount of electrolyte solution may be given to you to drink over a short period of time. This solution is used to clean out your colon.  You should be present 60 minutes prior to your procedure or as directed by your caregiver. AFTER THE PROCEDURE   If you received a sedative or pain relieving medication, you will need to arrange for someone to drive you home.  Occasionally, there is a little blood passed with the first bowel movement. Do not be concerned. FINDING OUT THE RESULTS OF YOUR TEST Not all test results are available during your visit. If your test results  are not back during the visit, make an appointment with your caregiver to find out the results. Do not assume everything is normal if you have not heard from your caregiver or the medical facility. It is important for you to follow up on all of your test results. HOME CARE INSTRUCTIONS   It is not unusual to pass moderate amounts of gas and experience mild abdominal cramping following the procedure. This is due to air being used to inflate your colon during the exam. Walking or a warm pack on your belly (abdomen) may help.  You may resume all normal meals and activities after sedatives and medicines have worn off.  Only take over-the-counter or prescription medicines for pain, discomfort, or fever as directed by your caregiver. Do not use aspirin or blood thinners if a biopsy was taken. Consult your caregiver for medicine usage if biopsies were taken. SEEK IMMEDIATE MEDICAL CARE IF:   You have a fever.  You pass large blood clots or fill a toilet with blood following the procedure. This may also occur 10 to 14 days following the procedure. This is more likely if a biopsy was taken.  You develop abdominal pain that keeps getting worse and cannot be relieved with medicine. Document Released: 07/25/2000 Document Revised: 10/20/2011 Document Reviewed: 03/09/2008 [facility] Patient Information 2014 [place], [place].  Patient has been scheduled for a colonoscopy on 02-08-13 at [facility]. This patient has been instructed to continue 325 mg aspirin. Also, patient has been asked to hold metformin day of colonoscopy prep and procedure.

## 2013-02-01 NOTE — Progress Notes (Addendum)
Patient ID: Suzanne Faulkner, female   DOB: Oct 02, 1940, 72 y.o.   MRN: [id number]  Chief Complaint  Patient presents with  . Other    5 year colonoscopy    HPI Suzanne Faulkner is a 72 y.o. female who presents for a 5 year colonoscopy evaluation. The patient denies any problems at this time. She has a history of colon polyps which were found on her 2009 colonoscopy. No known family history of colon problems.  The patient reports that she was hospitalized overnight earlier this month for a suspected TIA. She reported feeling finding, seeing bright lights and having difficulty speaking. She described that her symptoms resolved by the time her ED evaluation was complete. By her report a CT as well as carotid ultrasound was completed. No source for her symptoms was identified and she was instructed to increase her 81 mg aspirin 325 mg. She's had no recurrent symptoms. HPI  Past Medical History  Diagnosis Date  . Hypertension   . High cholesterol   . Diabetes mellitus   . Colon polyps 2009    Past Surgical History  Procedure Laterality Date  . Abdominal hysterectomy    . Cholecystectomy    . Joint replacement Left     knee  . Goiter removal      Family History  Problem Relation Age of Onset  . Heart disease Mother   . Diabetes Mother   . Cancer Father   . Cancer Sister     breast    Social History History  Substance Use Topics  . Smoking status: Former Smoker -- 0.50 packs/day    Types: Cigarettes  . Smokeless tobacco: Never Used  . Alcohol Use: No    No Known Allergies  Current Outpatient Prescriptions  Medication Sig Dispense Refill  . Ascorbic Acid (VITAMIN C PO) Take 1 tablet by mouth every morning.      Marland Kitchen aspirin 325 MG tablet Take 1 tablet (325 mg total) by mouth daily.  60 tablet  3  . b complex vitamins tablet Take 1 tablet by mouth every morning.       . benazepril (LOTENSIN) 20 MG tablet Take 20 mg by mouth every morning.       . Calcium Carbonate-Vitamin D (CALCIUM +  D PO) Take 1 tablet by mouth every morning.       . cholecalciferol (VITAMIN D) 1000 UNITS tablet Take 1,000 Units by mouth every morning.       . hydrochlorothiazide (MICROZIDE) 12.5 MG capsule Take 12.5 mg by mouth every morning.       . lovastatin (MEVACOR) 20 MG tablet Take 20 mg by mouth at bedtime.      . metFORMIN (GLUCOPHAGE) 500 MG tablet Take 500 mg by mouth 2 (two) times daily with a meal.      . polyethylene glycol powder (GLYCOLAX/MIRALAX) powder 255 grams one bottle for colonoscopy prep  255 g  0   No current facility-administered medications for this visit.    Review of Systems Review of Systems  Constitutional: Negative.   Respiratory: Negative.   Cardiovascular: Negative.   Gastrointestinal: Negative.     Blood pressure 140/78, pulse 74, resp. rate 14, height 5\' 6"  (1.676 m), weight 264 lb (119.75 kg).  Physical Exam Physical Exam  Constitutional: She appears well-developed and well-nourished.  Cardiovascular: Normal rate and regular rhythm.   Murmur heard.  Systolic murmur is present with a grade of 1/6  Pulmonary/Chest: Effort normal and breath sounds normal.  Neurological:  She is alert.  Skin: Skin is warm and dry.    Data Reviewed Colonoscopy dated 01/17/2008 showed a hyperplastic polyp in the sigmoid and a tubular adenoma in the transverse colon.  Assessment    History colonic polyps.  Recent possible CNS event.    Plan    Will attempt to review the records from her evaluation regarding the transient change in mental status. Assuming no contraindications are identified we'll plan to proceed with colonoscopy at a convenient day. Will allow her to continue her present 325 mg daily aspirin tablet, recognizing the very slight increase in bleeding that could occur during polypectomy.      Patient has been scheduled for a colonoscopy on 02-08-13 at [facility]. This patient has been instructed to continue 325 mg aspirin. Also, patient has been asked to hold  metformin day of colonoscopy prep and procedure.   Earline Mayotte 02/02/2013, 7:38 AM

## 2013-02-02 ENCOUNTER — Encounter: Payer: Self-pay | Admitting: General Surgery

## 2013-02-02 ENCOUNTER — Other Ambulatory Visit: Payer: Self-pay | Admitting: General Surgery

## 2013-02-02 DIAGNOSIS — Z860100 Personal history of colon polyps, unspecified: Secondary | ICD-10-CM | POA: Insufficient documentation

## 2013-02-02 DIAGNOSIS — Z1211 Encounter for screening for malignant neoplasm of colon: Secondary | ICD-10-CM

## 2013-02-08 ENCOUNTER — Ambulatory Visit: Payer: Self-pay | Admitting: General Surgery

## 2013-02-08 DIAGNOSIS — E119 Type 2 diabetes mellitus without complications: Secondary | ICD-10-CM | POA: Diagnosis not present

## 2013-02-08 DIAGNOSIS — D128 Benign neoplasm of rectum: Secondary | ICD-10-CM

## 2013-02-08 DIAGNOSIS — Z860100 Personal history of colon polyps, unspecified: Secondary | ICD-10-CM | POA: Diagnosis not present

## 2013-02-08 DIAGNOSIS — Z87891 Personal history of nicotine dependence: Secondary | ICD-10-CM | POA: Diagnosis not present

## 2013-02-08 DIAGNOSIS — D129 Benign neoplasm of anus and anal canal: Secondary | ICD-10-CM | POA: Diagnosis not present

## 2013-02-08 DIAGNOSIS — Z96659 Presence of unspecified artificial knee joint: Secondary | ICD-10-CM | POA: Diagnosis not present

## 2013-02-08 DIAGNOSIS — E669 Obesity, unspecified: Secondary | ICD-10-CM | POA: Diagnosis not present

## 2013-02-08 DIAGNOSIS — Z833 Family history of diabetes mellitus: Secondary | ICD-10-CM | POA: Diagnosis not present

## 2013-02-08 DIAGNOSIS — Z7982 Long term (current) use of aspirin: Secondary | ICD-10-CM | POA: Diagnosis not present

## 2013-02-08 DIAGNOSIS — Z809 Family history of malignant neoplasm, unspecified: Secondary | ICD-10-CM | POA: Diagnosis not present

## 2013-02-08 DIAGNOSIS — D126 Benign neoplasm of colon, unspecified: Secondary | ICD-10-CM | POA: Diagnosis not present

## 2013-02-08 DIAGNOSIS — I10 Essential (primary) hypertension: Secondary | ICD-10-CM | POA: Diagnosis not present

## 2013-02-08 DIAGNOSIS — Z79899 Other long term (current) drug therapy: Secondary | ICD-10-CM | POA: Diagnosis not present

## 2013-02-08 DIAGNOSIS — Z803 Family history of malignant neoplasm of breast: Secondary | ICD-10-CM | POA: Diagnosis not present

## 2013-02-10 ENCOUNTER — Encounter: Payer: Self-pay | Admitting: General Surgery

## 2013-02-15 ENCOUNTER — Encounter: Payer: Self-pay | Admitting: General Surgery

## 2013-02-15 ENCOUNTER — Telehealth: Payer: Self-pay | Admitting: *Deleted

## 2013-02-15 NOTE — Telephone Encounter (Signed)
Notified patient as instructed, polyp biopsy done 02-09-13 was benign, patient pleased. Discussed follow-up appointments for 5 years, patient agrees

## 2013-03-16 ENCOUNTER — Other Ambulatory Visit: Payer: Self-pay

## 2013-04-29 DIAGNOSIS — R7309 Other abnormal glucose: Secondary | ICD-10-CM | POA: Diagnosis not present

## 2013-04-29 DIAGNOSIS — Z23 Encounter for immunization: Secondary | ICD-10-CM | POA: Diagnosis not present

## 2013-04-29 DIAGNOSIS — M549 Dorsalgia, unspecified: Secondary | ICD-10-CM | POA: Diagnosis not present

## 2013-04-29 DIAGNOSIS — I10 Essential (primary) hypertension: Secondary | ICD-10-CM | POA: Diagnosis not present

## 2013-05-24 ENCOUNTER — Ambulatory Visit: Payer: Self-pay | Admitting: Family Medicine

## 2013-05-24 DIAGNOSIS — Z1330 Encounter for screening examination for mental health and behavioral disorders, unspecified: Secondary | ICD-10-CM | POA: Diagnosis not present

## 2013-05-24 DIAGNOSIS — M19079 Primary osteoarthritis, unspecified ankle and foot: Secondary | ICD-10-CM | POA: Diagnosis not present

## 2013-05-24 DIAGNOSIS — Z1331 Encounter for screening for depression: Secondary | ICD-10-CM | POA: Diagnosis not present

## 2013-05-24 DIAGNOSIS — Z23 Encounter for immunization: Secondary | ICD-10-CM | POA: Diagnosis not present

## 2013-05-24 DIAGNOSIS — M899 Disorder of bone, unspecified: Secondary | ICD-10-CM | POA: Diagnosis not present

## 2013-05-24 DIAGNOSIS — M79609 Pain in unspecified limb: Secondary | ICD-10-CM | POA: Diagnosis not present

## 2013-06-16 ENCOUNTER — Other Ambulatory Visit: Payer: Self-pay

## 2013-06-20 ENCOUNTER — Encounter (HOSPITAL_COMMUNITY): Payer: Self-pay | Admitting: Emergency Medicine

## 2013-06-20 ENCOUNTER — Emergency Department (HOSPITAL_COMMUNITY)
Admission: EM | Admit: 2013-06-20 | Discharge: 2013-06-20 | Disposition: A | Discharge status: Home / Self Care | Payer: Medicare Other | Attending: Emergency Medicine | Admitting: Emergency Medicine

## 2013-06-20 DIAGNOSIS — Z7982 Long term (current) use of aspirin: Secondary | ICD-10-CM | POA: Diagnosis not present

## 2013-06-20 DIAGNOSIS — Z79899 Other long term (current) drug therapy: Secondary | ICD-10-CM | POA: Insufficient documentation

## 2013-06-20 DIAGNOSIS — E119 Type 2 diabetes mellitus without complications: Secondary | ICD-10-CM | POA: Diagnosis not present

## 2013-06-20 DIAGNOSIS — E7800 Pure hypercholesterolemia, unspecified: Secondary | ICD-10-CM | POA: Insufficient documentation

## 2013-06-20 DIAGNOSIS — Z87891 Personal history of nicotine dependence: Secondary | ICD-10-CM | POA: Insufficient documentation

## 2013-06-20 DIAGNOSIS — Z860100 Personal history of colon polyps, unspecified: Secondary | ICD-10-CM | POA: Insufficient documentation

## 2013-06-20 DIAGNOSIS — M549 Dorsalgia, unspecified: Secondary | ICD-10-CM

## 2013-06-20 DIAGNOSIS — I10 Essential (primary) hypertension: Secondary | ICD-10-CM | POA: Diagnosis not present

## 2013-06-20 DIAGNOSIS — M5416 Radiculopathy, lumbar region: Secondary | ICD-10-CM

## 2013-06-20 DIAGNOSIS — IMO0002 Reserved for concepts with insufficient information to code with codable children: Secondary | ICD-10-CM | POA: Diagnosis not present

## 2013-06-20 DIAGNOSIS — M545 Low back pain, unspecified: Secondary | ICD-10-CM | POA: Diagnosis not present

## 2013-06-20 MED ORDER — HYDROCODONE-ACETAMINOPHEN 5-325 MG PO TABS: 1.0000 | ORAL_TABLET | Freq: Four times a day (QID) | ORAL | Status: DC | PRN

## 2013-06-20 NOTE — ED Provider Notes (Signed)
CSN: [id number]     Arrival date & time 06/20/13  1142 History  This chart was scribed for non-physician practitioner Roxy Horseman, PA-C working with Flint Melter, MD by Leone Payor, ED Scribe. This patient was seen in room TR11C/TR11C and the patient's care was started at 1142.    Chief Complaint  Patient presents with  . Buttock pain     The history is provided by the patient. No language interpreter was used.    HPI Comments: Suzanne Faulkner is a 72 y.o. female who presents to the Emergency Department complaining of constant, unchanged tailbone pain that began 3 days ago. Pt states she has a history of back pain and has been seen by spine specialists. She denies similar tailbone symptoms in the past. She states she is unable to sit or lay down for long periods at a time. Pt states she has trouble standing for long periods of time as well. She has been seen by her PCP and by [facility] without any diagnoses.  Pt states she has been offered prescription pain medications but has refused because she does not want to become addicted. She has taken Aleve at home without relief. She denies fevers, change in bowel or bladder function, numbness.    PCP Dr. Elease Hashimoto Past Medical History  Diagnosis Date  . Hypertension   . High cholesterol   . Diabetes mellitus   . Colon polyps 2009   Past Surgical History  Procedure Laterality Date  . Abdominal hysterectomy    . Cholecystectomy    . Joint replacement Left     knee  . Goiter removal     Family History  Problem Relation Age of Onset  . Heart disease Mother   . Diabetes Mother   . Cancer Father   . Cancer Sister     breast   History  Substance Use Topics  . Smoking status: Former Smoker -- 0.50 packs/day    Types: Cigarettes  . Smokeless tobacco: Never Used  . Alcohol Use: No   OB History   Grav Para Term Preterm Abortions TAB SAB Ect Mult Living                 Review of Systems A complete 10 system review of systems was  obtained and all systems are negative except as noted in the HPI and PMH.   Allergies  Review of patient's allergies indicates no known allergies.  Home Medications   Current Outpatient Rx  Name  Route  Sig  Dispense  Refill  . Ascorbic Acid (VITAMIN C PO)   Oral   Take 1 tablet by mouth every morning.         Marland Kitchen aspirin 325 MG tablet   Oral   Take 325 mg by mouth daily.         Marland Kitchen b complex vitamins tablet   Oral   Take 1 tablet by mouth every morning.          . benazepril (LOTENSIN) 20 MG tablet   Oral   Take 20 mg by mouth every morning.          . Calcium Carbonate-Vitamin D (CALCIUM + D PO)   Oral   Take 1 tablet by mouth every morning.          . cholecalciferol (VITAMIN D) 1000 UNITS tablet   Oral   Take 1,000 Units by mouth every morning.          . hydrochlorothiazide (MICROZIDE)  12.5 MG capsule   Oral   Take 12.5 mg by mouth every morning.          . lovastatin (MEVACOR) 20 MG tablet   Oral   Take 20 mg by mouth at bedtime.         . Magnesium 300 MG CAPS   Oral   Take 1 capsule by mouth daily.         . metFORMIN (GLUCOPHAGE) 500 MG tablet   Oral   Take 500 mg by mouth 2 (two) times daily with a meal.          BP 142/102  Pulse 59  Temp(Src) 98.1 F (36.7 C) (Oral)  Resp 20  Wt 299 lb 7 oz (135.824 kg)  SpO2 99% Physical Exam  Nursing note and vitals reviewed. Constitutional: She is oriented to person, place, and time. She appears well-developed and well-nourished. No distress.  HENT:  Head: Normocephalic and atraumatic.  Eyes: Conjunctivae and EOM are normal. Right eye exhibits no discharge. Left eye exhibits no discharge. No scleral icterus.  Neck: Normal range of motion. Neck supple. No tracheal deviation present.  Cardiovascular: Normal rate, regular rhythm and normal heart sounds.  Exam reveals no gallop and no friction rub.   No murmur heard. Pulmonary/Chest: Effort normal and breath sounds normal. No respiratory  distress. She has no wheezes.  Abdominal: Soft. She exhibits no distension. There is no tenderness.  Musculoskeletal: Normal range of motion.  Lumbar paraspinal muscles tender to palpation, no bony tenderness, step-offs, or gross abnormality or deformity of spine, patient is able to ambulate, moves all extremities  Bilateral great toe extension intact Bilateral plantar/dorsiflexion intact  Neurological: She is alert and oriented to person, place, and time. She has normal reflexes. She displays normal reflexes.  Sensation and strength intact bilaterally Symmetrical reflexes  Skin: Skin is warm and dry. She is not diaphoretic.  Psychiatric: She has a normal mood and affect. Her behavior is normal. Judgment and thought content normal.    ED Course  Procedures   DIAGNOSTIC STUDIES: Oxygen Saturation is 99% on RA, normal by my interpretation.    COORDINATION OF CARE: 12:53 PM Discussed treatment plan with pt at bedside and pt agreed to plan.    Labs Review Labs Reviewed - No data to display Imaging Review No results found.  EKG Interpretation   None       MDM   1. Back pain   2. Lumbar radiculopathy    Patient with back pain.  No neurological deficits and normal neuro exam.  Patient can walk but states is painful.  No loss of bowel or bladder control.  No concern for cauda equina.  No fever, night sweats, weight loss, h/o cancer, IVDU.  RICE protocol and pain medicine indicated and discussed with patient.   Recommend neurosurgery followup. Patient seen by and discussed with Dr. Effie Shy, who agrees with plan.   I personally performed the services described in this documentation, which was scribed in my presence. The recorded information has been reviewed and is accurate.    Roxy Horseman, PA-C 06/20/13 1327

## 2013-06-20 NOTE — ED Provider Notes (Signed)
  Face-to-face evaluation   History: She has had bilateral buttock pain for several days. She has a history of lumbar degenerative disease with radicular leg pain, bilaterally. No recent fall.  Physical exam: Alert, obese, elderly, female. She is able to stand and walk, but has increased pain with extension of the lumbar spine  Medical screening examination/treatment/procedure(s) were conducted as a shared visit with non-physician practitioner(s) and myself.  I personally evaluated the patient during the encounter  Flint Melter, MD 06/20/13 1718

## 2013-06-20 NOTE — ED Notes (Signed)
Pt is here with tailbone pain that radiates down both legs.  Denies injuries.  No urinary symptoms.

## 2013-06-20 NOTE — ED Notes (Signed)
EDP and PA speaking with patient at bedside.

## 2013-06-27 ENCOUNTER — Other Ambulatory Visit: Payer: Self-pay | Admitting: Neurosurgery

## 2013-06-27 DIAGNOSIS — M545 Low back pain, unspecified: Secondary | ICD-10-CM | POA: Diagnosis not present

## 2013-06-27 DIAGNOSIS — M48061 Spinal stenosis, lumbar region without neurogenic claudication: Secondary | ICD-10-CM | POA: Diagnosis not present

## 2013-07-11 ENCOUNTER — Ambulatory Visit
Admission: RE | Admit: 2013-07-11 | Discharge: 2013-07-11 | Disposition: A | Discharge status: Home / Self Care | Payer: PRIVATE HEALTH INSURANCE | Source: Ambulatory Visit | Attending: Neurosurgery | Admitting: Neurosurgery

## 2013-07-11 DIAGNOSIS — M48061 Spinal stenosis, lumbar region without neurogenic claudication: Secondary | ICD-10-CM

## 2013-07-11 DIAGNOSIS — M519 Unspecified thoracic, thoracolumbar and lumbosacral intervertebral disc disorder: Secondary | ICD-10-CM | POA: Diagnosis not present

## 2013-07-12 DIAGNOSIS — M47817 Spondylosis without myelopathy or radiculopathy, lumbosacral region: Secondary | ICD-10-CM | POA: Diagnosis not present

## 2013-07-12 DIAGNOSIS — M48061 Spinal stenosis, lumbar region without neurogenic claudication: Secondary | ICD-10-CM | POA: Diagnosis not present

## 2013-07-25 DIAGNOSIS — M47817 Spondylosis without myelopathy or radiculopathy, lumbosacral region: Secondary | ICD-10-CM | POA: Diagnosis not present

## 2013-07-25 DIAGNOSIS — M48061 Spinal stenosis, lumbar region without neurogenic claudication: Secondary | ICD-10-CM | POA: Diagnosis not present

## 2013-07-25 DIAGNOSIS — M545 Low back pain, unspecified: Secondary | ICD-10-CM | POA: Diagnosis not present

## 2013-08-22 DIAGNOSIS — M545 Low back pain, unspecified: Secondary | ICD-10-CM | POA: Diagnosis not present

## 2013-08-22 DIAGNOSIS — M47817 Spondylosis without myelopathy or radiculopathy, lumbosacral region: Secondary | ICD-10-CM | POA: Diagnosis not present

## 2013-08-22 DIAGNOSIS — M48061 Spinal stenosis, lumbar region without neurogenic claudication: Secondary | ICD-10-CM | POA: Diagnosis not present

## 2013-09-14 DIAGNOSIS — IMO0001 Reserved for inherently not codable concepts without codable children: Secondary | ICD-10-CM | POA: Diagnosis not present

## 2013-09-14 DIAGNOSIS — E11329 Type 2 diabetes mellitus with mild nonproliferative diabetic retinopathy without macular edema: Secondary | ICD-10-CM | POA: Diagnosis not present

## 2013-10-03 DIAGNOSIS — M545 Low back pain, unspecified: Secondary | ICD-10-CM | POA: Diagnosis not present

## 2013-10-03 DIAGNOSIS — M48061 Spinal stenosis, lumbar region without neurogenic claudication: Secondary | ICD-10-CM | POA: Diagnosis not present

## 2013-10-11 DIAGNOSIS — M545 Low back pain, unspecified: Secondary | ICD-10-CM | POA: Diagnosis not present

## 2013-10-11 DIAGNOSIS — M47817 Spondylosis without myelopathy or radiculopathy, lumbosacral region: Secondary | ICD-10-CM | POA: Diagnosis not present

## 2013-10-28 DIAGNOSIS — I10 Essential (primary) hypertension: Secondary | ICD-10-CM | POA: Diagnosis not present

## 2013-10-28 DIAGNOSIS — R7309 Other abnormal glucose: Secondary | ICD-10-CM | POA: Diagnosis not present

## 2013-10-28 DIAGNOSIS — R350 Frequency of micturition: Secondary | ICD-10-CM | POA: Diagnosis not present

## 2013-10-28 DIAGNOSIS — E7800 Pure hypercholesterolemia, unspecified: Secondary | ICD-10-CM | POA: Diagnosis not present

## 2014-04-28 DIAGNOSIS — Z23 Encounter for immunization: Secondary | ICD-10-CM | POA: Diagnosis not present

## 2014-04-28 DIAGNOSIS — M79609 Pain in unspecified limb: Secondary | ICD-10-CM | POA: Diagnosis not present

## 2014-04-28 DIAGNOSIS — R7309 Other abnormal glucose: Secondary | ICD-10-CM | POA: Diagnosis not present

## 2014-04-28 DIAGNOSIS — R350 Frequency of micturition: Secondary | ICD-10-CM | POA: Diagnosis not present

## 2014-04-28 DIAGNOSIS — I10 Essential (primary) hypertension: Secondary | ICD-10-CM | POA: Diagnosis not present

## 2014-04-28 DIAGNOSIS — E7800 Pure hypercholesterolemia, unspecified: Secondary | ICD-10-CM | POA: Diagnosis not present

## 2014-05-23 DIAGNOSIS — I10 Essential (primary) hypertension: Secondary | ICD-10-CM | POA: Diagnosis not present

## 2014-05-30 ENCOUNTER — Encounter (HOSPITAL_COMMUNITY): Payer: Self-pay | Admitting: Emergency Medicine

## 2014-05-30 ENCOUNTER — Emergency Department (HOSPITAL_COMMUNITY): Payer: Medicare Other

## 2014-05-30 ENCOUNTER — Emergency Department (HOSPITAL_COMMUNITY)
Admission: EM | Admit: 2014-05-30 | Discharge: 2014-05-30 | Disposition: A | Discharge status: Home / Self Care | Payer: Medicare Other | Attending: Emergency Medicine | Admitting: Emergency Medicine

## 2014-05-30 DIAGNOSIS — Z79899 Other long term (current) drug therapy: Secondary | ICD-10-CM | POA: Insufficient documentation

## 2014-05-30 DIAGNOSIS — Z7982 Long term (current) use of aspirin: Secondary | ICD-10-CM | POA: Diagnosis not present

## 2014-05-30 DIAGNOSIS — Z87891 Personal history of nicotine dependence: Secondary | ICD-10-CM | POA: Insufficient documentation

## 2014-05-30 DIAGNOSIS — M5431 Sciatica, right side: Secondary | ICD-10-CM | POA: Insufficient documentation

## 2014-05-30 DIAGNOSIS — E119 Type 2 diabetes mellitus without complications: Secondary | ICD-10-CM | POA: Diagnosis not present

## 2014-05-30 DIAGNOSIS — M79604 Pain in right leg: Secondary | ICD-10-CM | POA: Diagnosis present

## 2014-05-30 DIAGNOSIS — M25551 Pain in right hip: Secondary | ICD-10-CM | POA: Diagnosis not present

## 2014-05-30 DIAGNOSIS — Z96659 Presence of unspecified artificial knee joint: Secondary | ICD-10-CM | POA: Diagnosis not present

## 2014-05-30 DIAGNOSIS — I10 Essential (primary) hypertension: Secondary | ICD-10-CM | POA: Diagnosis not present

## 2014-05-30 DIAGNOSIS — M1611 Unilateral primary osteoarthritis, right hip: Secondary | ICD-10-CM | POA: Diagnosis not present

## 2014-05-30 DIAGNOSIS — Z780 Asymptomatic menopausal state: Secondary | ICD-10-CM | POA: Insufficient documentation

## 2014-05-30 DIAGNOSIS — Z86010 Personal history of colonic polyps: Secondary | ICD-10-CM | POA: Insufficient documentation

## 2014-05-30 MED ORDER — OXYCODONE-ACETAMINOPHEN 5-325 MG PO TABS
1.0000 | ORAL_TABLET | Freq: Once | ORAL | Status: AC
Start: 2014-05-30 — End: 2014-05-30
  Administered 2014-05-30: 1 via ORAL
  Filled 2014-05-30: qty 1

## 2014-05-30 MED ORDER — OXYCODONE-ACETAMINOPHEN 5-325 MG PO TABS: 1.0000 | ORAL_TABLET | ORAL | Status: DC | PRN

## 2014-05-30 NOTE — ED Notes (Signed)
Pt reports right hip pain which radiates down her leg. Pt reports that it has been like this for months and that she has received shots for the pain. Pt denies any injury to the leg, but reports that the pain got worse at 4am today.

## 2014-05-30 NOTE — ED Provider Notes (Signed)
Patient has history of sciatica nerve pain and he is here with an exacerbation.  It is a sharp pain on the right side with radiation down her leg.  She has pins and needles sensation in the leg.  She states this is consistent with her normal pain that has been treated with aleve as needed for the past couple of months.  Positive straight leg raise on Right side.  She denies any new back pain, history of fever, malignancy, or neuro deficits.  Supportive car and f/u outpt for worsening sciatica pain.  Medical screening examination/treatment/procedure(s) were conducted as a shared visit with non-physician practitioner(s) and myself.  I personally evaluated the patient during the encounter.   EKG Interpretation None        Everlene Balls, MD 05/30/14 1515

## 2014-05-30 NOTE — ED Provider Notes (Signed)
CSN: [id number]     Arrival date & time 05/30/14  9562 History   First MD Initiated Contact with Patient 05/30/14 0602     Chief Complaint  Patient presents with  . Leg Pain     (Consider location/radiation/quality/duration/timing/severity/associated sxs/prior Treatment) HPI Comments: Patient is a 73 year old female past medical history significant for hypertension, hyperlipidemia, DM presented to emergency department for four months of right sided hip pain with radiation down leg with associated pins and needles sensation that worsened this morning at 4am. She is followed by [facility] for her back pain where she intermittently received IM pain medications. Denies any fevers, chills, nausea, vomiting, bladder or bowel incontinence, nightsweats, history of cancer or IVDA.   Patient is a 73 y.o. female presenting with leg pain.  Leg Pain   Past Medical History  Diagnosis Date  . Hypertension   . High cholesterol   . Diabetes mellitus   . Colon polyps 2009   Past Surgical History  Procedure Laterality Date  . Abdominal hysterectomy    . Cholecystectomy    . Joint replacement Left     knee  . Goiter removal     Family History  Problem Relation Age of Onset  . Heart disease Mother   . Diabetes Mother   . Cancer Father   . Cancer Sister     breast   History  Substance Use Topics  . Smoking status: Former Smoker -- 0.50 packs/day    Types: Cigarettes  . Smokeless tobacco: Never Used  . Alcohol Use: No   OB History   Grav Para Term Preterm Abortions TAB SAB Ect Mult Living                 Review of Systems  Musculoskeletal: Positive for arthralgias.  All other systems reviewed and are negative.     Allergies  Review of patient's allergies indicates no known allergies.  Home Medications   Prior to Admission medications   Medication Sig Start Date End Date Taking? Authorizing Provider  Ascorbic Acid (VITAMIN C PO) Take 1 tablet by mouth every  morning.   Yes Historical Provider, MD  aspirin 325 MG tablet Take 325 mg by mouth daily. 01/26/13  Yes Ripudeep Krystal Eaton, MD  b complex vitamins tablet Take 1 tablet by mouth every morning.    Yes Historical Provider, MD  benazepril (LOTENSIN) 20 MG tablet Take 20 mg by mouth every morning.    Yes Historical Provider, MD  Calcium Carbonate-Vitamin D (CALCIUM + D PO) Take 1 tablet by mouth every morning.    Yes Historical Provider, MD  cholecalciferol (VITAMIN D) 1000 UNITS tablet Take 1,000 Units by mouth every morning.    Yes Historical Provider, MD  hydrochlorothiazide (MICROZIDE) 12.5 MG capsule Take 12.5 mg by mouth every morning.    Yes Historical Provider, MD  lovastatin (MEVACOR) 20 MG tablet Take 20 mg by mouth at bedtime.   Yes Historical Provider, MD  Magnesium 300 MG CAPS Take 1 capsule by mouth daily.   Yes Historical Provider, MD  metFORMIN (GLUCOPHAGE) 500 MG tablet Take 500 mg by mouth 2 (two) times daily with a meal.   Yes Historical Provider, MD  oxyCODONE-acetaminophen (PERCOCET/ROXICET) 5-325 MG per tablet Take 1 tablet by mouth every 4 (four) hours as needed for severe pain. May take 2 tablets PO q 6 hours for severe pain - Do not take with Tylenol as this tablet already contains tylenol 05/30/14   [place],  PA-C   BP 168/92  Pulse 55  Temp(Src) 97.9 F (36.6 C) (Oral)  Resp 18  Ht 5\' 6"  (1.676 m)  Wt 273 lb (123.832 kg)  BMI 44.08 kg/m2  SpO2 100% Physical Exam  Nursing note and vitals reviewed. Constitutional: She is oriented to person, place, and time. She appears well-developed and well-nourished. No distress.  HENT:  Head: Normocephalic and atraumatic.  Right Ear: External ear normal.  Left Ear: External ear normal.  Nose: Nose normal.  Mouth/Throat: Oropharynx is clear and moist. No oropharyngeal exudate.  Eyes: Conjunctivae and EOM are normal. Pupils are equal, round, and reactive to light.  Neck: Normal range of motion. Neck supple.   Cardiovascular: Normal rate, regular rhythm, normal heart sounds and intact distal pulses.   Pulmonary/Chest: Effort normal and breath sounds normal. No respiratory distress.  Abdominal: Soft. There is no tenderness.  Musculoskeletal: She exhibits tenderness.       Right hip: She exhibits tenderness. She exhibits normal range of motion, normal strength, no bony tenderness, no swelling, no crepitus, no deformity and no laceration.       Left hip: Normal.       Cervical back: She exhibits normal range of motion, no tenderness and no bony tenderness.       Thoracic back: She exhibits normal range of motion, no tenderness and no bony tenderness.       Lumbar back: She exhibits tenderness. She exhibits normal range of motion and no bony tenderness.  Neurological: She is alert and oriented to person, place, and time. She has normal strength. No cranial nerve deficit. Gait normal. GCS eye subscore is 4. GCS verbal subscore is 5. GCS motor subscore is 6.  Sensation grossly intact.  No pronator drift.  Bilateral heel-knee-shin intact.  Skin: Skin is warm and dry. She is not diaphoretic.    ED Course  Procedures (including critical care time) Medications  oxyCODONE-acetaminophen (PERCOCET/ROXICET) 5-325 MG per tablet 1 tablet (1 tablet Oral Given 05/30/14 0757)    Labs Review Labs Reviewed - No data to display  Imaging Review Dg Hip Complete Right  05/30/2014   CLINICAL DATA:  Right hip pain.  No injury.  EXAM: RIGHT HIP - COMPLETE 2+ VIEW  COMPARISON:  None.  FINDINGS: Degenerative changes in both hips, greater on the right. Degenerative changes in the lower lumbar spine. Pelvis, SI joints, and symphysis pubis appear intact. Right hip demonstrates no evidence of acute fracture or dislocation. No focal bone lesion or bone destruction identified.  IMPRESSION: Degenerative changes in the right hip.  No acute bony abnormalities.   Electronically Signed   By: Lucienne Capers M.D.   On: 05/30/2014  06:30     EKG Interpretation None      MDM   Final diagnoses:  Sciatica, right    Filed Vitals:   05/30/14 0745  BP: 168/92  Pulse: 55  Temp:   Resp:    Afebrile, NAD, non-toxic appearing, AAOx4. Patient with back pain.  No neurological deficits and normal neuro exam.  Patient can walk but states is painful.  No loss of bowel or bladder control.  No concern for cauda equina.  No fever, night sweats, weight loss, h/o cancer, IVDU.  RICE protocol and pain medicine indicated and discussed with patient. Patient is stable at time of discharge. Patient d/w with Dr. Claudine Mouton, agrees with plan.         Harlow Mares, PA-C 05/30/14 1507

## 2014-05-30 NOTE — Discharge Instructions (Signed)
Please follow up with your primary care physician in 1-2 days. If you do not have one please call the [facility] number listed above. Please take pain medication and/or muscle relaxants as prescribed and as needed for pain. Please do not drive on narcotic pain medication or on muscle relaxants. .jdpc  Sciatica Sciatica is pain, weakness, numbness, or tingling along the path of the sciatic nerve. The nerve starts in the lower back and runs down the back of each leg. The nerve controls the muscles in the lower leg and in the back of the knee, while also providing sensation to the back of the thigh, lower leg, and the sole of your foot. Sciatica is a symptom of another medical condition. For instance, nerve damage or certain conditions, such as a herniated disk or bone spur on the spine, pinch or put pressure on the sciatic nerve. This causes the pain, weakness, or other sensations normally associated with sciatica. Generally, sciatica only affects one side of the body. CAUSES   Herniated or slipped disc.  Degenerative disk disease.  A pain disorder involving the narrow muscle in the buttocks (piriformis syndrome).  Pelvic injury or fracture.  Pregnancy.  Tumor (rare). SYMPTOMS  Symptoms can vary from mild to very severe. The symptoms usually travel from the low back to the buttocks and down the back of the leg. Symptoms can include:  Mild tingling or dull aches in the lower back, leg, or hip.  Numbness in the back of the calf or sole of the foot.  Burning sensations in the lower back, leg, or hip.  Sharp pains in the lower back, leg, or hip.  Leg weakness.  Severe back pain inhibiting movement. These symptoms may get worse with coughing, sneezing, laughing, or prolonged sitting or standing. Also, being overweight may worsen symptoms. DIAGNOSIS  Your caregiver will perform a physical exam to look for common symptoms of sciatica. He or she may ask you to do certain  movements or activities that would trigger sciatic nerve pain. Other tests may be performed to find the cause of the sciatica. These may include:  Blood tests.  X-rays.  Imaging tests, such as an MRI or CT scan. TREATMENT  Treatment is directed at the cause of the sciatic pain. Sometimes, treatment is not necessary and the pain and discomfort goes away on its own. If treatment is needed, your caregiver may suggest:  Over-the-counter medicines to relieve pain.  Prescription medicines, such as anti-inflammatory medicine, muscle relaxants, or narcotics.  Applying heat or ice to the painful area.  Steroid injections to lessen pain, irritation, and inflammation around the nerve.  Reducing activity during periods of pain.  Exercising and stretching to strengthen your abdomen and improve flexibility of your spine. Your caregiver may suggest losing weight if the extra weight makes the back pain worse.  Physical therapy.  Surgery to eliminate what is pressing or pinching the nerve, such as a bone spur or part of a herniated disk. HOME CARE INSTRUCTIONS   Only take over-the-counter or prescription medicines for pain or discomfort as directed by your caregiver.  Apply ice to the affected area for 20 minutes, 3-4 times a day for the first 48-72 hours. Then try heat in the same way.  Exercise, stretch, or perform your usual activities if these do not aggravate your pain.  Attend physical therapy sessions as directed by your caregiver.  Keep all follow-up appointments as directed by your caregiver.  Do not wear high  heels or shoes that do not provide proper support.  Check your mattress to see if it is too soft. A firm mattress may lessen your pain and discomfort. SEEK IMMEDIATE MEDICAL CARE IF:   You lose control of your bowel or bladder (incontinence).  You have increasing weakness in the lower back, pelvis, buttocks, or legs.  You have redness or swelling of your back.  You have  a burning sensation when you urinate.  You have pain that gets worse when you lie down or awakens you at night.  Your pain is worse than you have experienced in the past.  Your pain is lasting longer than 4 weeks.  You are suddenly losing weight without reason. MAKE SURE YOU:  Understand these instructions.  Will watch your condition.  Will get help right away if you are not doing well or get worse. Document Released: 07/22/2001 Document Revised: 01/27/2012 Document Reviewed: 12/07/2011 [facility] Patient Information 2015 [place], [place]. This information is not intended to replace advice given to you by your health care provider. Make sure you discuss any questions you have with your health care provider.

## 2014-05-31 DIAGNOSIS — M5431 Sciatica, right side: Secondary | ICD-10-CM | POA: Diagnosis not present

## 2014-06-01 DIAGNOSIS — M47817 Spondylosis without myelopathy or radiculopathy, lumbosacral region: Secondary | ICD-10-CM | POA: Diagnosis not present

## 2014-06-01 DIAGNOSIS — M545 Low back pain: Secondary | ICD-10-CM | POA: Diagnosis not present

## 2014-06-09 DIAGNOSIS — E1142 Type 2 diabetes mellitus with diabetic polyneuropathy: Secondary | ICD-10-CM | POA: Diagnosis not present

## 2014-06-09 DIAGNOSIS — M47817 Spondylosis without myelopathy or radiculopathy, lumbosacral region: Secondary | ICD-10-CM | POA: Diagnosis not present

## 2014-06-09 DIAGNOSIS — Z6841 Body Mass Index (BMI) 40.0 and over, adult: Secondary | ICD-10-CM | POA: Diagnosis not present

## 2014-06-09 DIAGNOSIS — M545 Low back pain: Secondary | ICD-10-CM | POA: Diagnosis not present

## 2014-06-14 ENCOUNTER — Ambulatory Visit: Payer: Self-pay | Admitting: Family Medicine

## 2014-06-14 DIAGNOSIS — Z1231 Encounter for screening mammogram for malignant neoplasm of breast: Secondary | ICD-10-CM | POA: Diagnosis not present

## 2014-06-14 LAB — HM MAMMOGRAPHY

## 2014-06-23 DIAGNOSIS — E1142 Type 2 diabetes mellitus with diabetic polyneuropathy: Secondary | ICD-10-CM | POA: Diagnosis not present

## 2014-06-23 DIAGNOSIS — M47817 Spondylosis without myelopathy or radiculopathy, lumbosacral region: Secondary | ICD-10-CM | POA: Diagnosis not present

## 2014-06-23 DIAGNOSIS — M47816 Spondylosis without myelopathy or radiculopathy, lumbar region: Secondary | ICD-10-CM | POA: Diagnosis not present

## 2014-06-23 DIAGNOSIS — M545 Low back pain: Secondary | ICD-10-CM | POA: Diagnosis not present

## 2014-07-31 DIAGNOSIS — E1142 Type 2 diabetes mellitus with diabetic polyneuropathy: Secondary | ICD-10-CM | POA: Diagnosis not present

## 2014-07-31 DIAGNOSIS — M47817 Spondylosis without myelopathy or radiculopathy, lumbosacral region: Secondary | ICD-10-CM | POA: Diagnosis not present

## 2014-07-31 DIAGNOSIS — M545 Low back pain: Secondary | ICD-10-CM | POA: Diagnosis not present

## 2014-08-30 DIAGNOSIS — R739 Hyperglycemia, unspecified: Secondary | ICD-10-CM | POA: Diagnosis not present

## 2014-08-30 DIAGNOSIS — M549 Dorsalgia, unspecified: Secondary | ICD-10-CM | POA: Diagnosis not present

## 2014-08-30 DIAGNOSIS — I129 Hypertensive chronic kidney disease with stage 1 through stage 4 chronic kidney disease, or unspecified chronic kidney disease: Secondary | ICD-10-CM | POA: Diagnosis not present

## 2014-08-30 DIAGNOSIS — E780 Pure hypercholesterolemia: Secondary | ICD-10-CM | POA: Diagnosis not present

## 2014-09-04 DIAGNOSIS — M549 Dorsalgia, unspecified: Secondary | ICD-10-CM | POA: Diagnosis not present

## 2014-09-04 DIAGNOSIS — R739 Hyperglycemia, unspecified: Secondary | ICD-10-CM | POA: Diagnosis not present

## 2014-09-04 DIAGNOSIS — E119 Type 2 diabetes mellitus without complications: Secondary | ICD-10-CM | POA: Diagnosis not present

## 2014-10-17 DIAGNOSIS — E11319 Type 2 diabetes mellitus with unspecified diabetic retinopathy without macular edema: Secondary | ICD-10-CM | POA: Diagnosis not present

## 2014-12-25 DIAGNOSIS — E11329 Type 2 diabetes mellitus with mild nonproliferative diabetic retinopathy without macular edema: Secondary | ICD-10-CM | POA: Diagnosis not present

## 2014-12-26 DIAGNOSIS — I10 Essential (primary) hypertension: Secondary | ICD-10-CM | POA: Diagnosis not present

## 2014-12-26 DIAGNOSIS — E119 Type 2 diabetes mellitus without complications: Secondary | ICD-10-CM | POA: Diagnosis not present

## 2014-12-26 DIAGNOSIS — E780 Pure hypercholesterolemia: Secondary | ICD-10-CM | POA: Diagnosis not present

## 2014-12-26 DIAGNOSIS — M545 Low back pain: Secondary | ICD-10-CM | POA: Diagnosis not present

## 2015-01-29 ENCOUNTER — Other Ambulatory Visit: Payer: Self-pay | Admitting: Family Medicine

## 2015-01-29 DIAGNOSIS — I10 Essential (primary) hypertension: Secondary | ICD-10-CM

## 2015-01-30 NOTE — Telephone Encounter (Signed)
Last refill 08/02/2014. LOV 12/26/2014. Renaldo Fiddler, CMA

## 2015-03-07 ENCOUNTER — Other Ambulatory Visit: Payer: Self-pay | Admitting: Family Medicine

## 2015-03-07 DIAGNOSIS — I10 Essential (primary) hypertension: Secondary | ICD-10-CM

## 2015-03-07 DIAGNOSIS — E119 Type 2 diabetes mellitus without complications: Secondary | ICD-10-CM

## 2015-03-07 NOTE — Telephone Encounter (Signed)
Last OV 12/2014 

## 2015-03-15 ENCOUNTER — Other Ambulatory Visit: Payer: Self-pay | Admitting: Family Medicine

## 2015-03-15 DIAGNOSIS — I10 Essential (primary) hypertension: Secondary | ICD-10-CM

## 2015-03-15 NOTE — Telephone Encounter (Signed)
I believe this is Dr. Sharyon Medicus patient and they have it down for Dr. Darnell Level because he refilled it when she was maybe out of the office.  Thanks  [employer]

## 2015-04-04 ENCOUNTER — Other Ambulatory Visit: Payer: Self-pay | Admitting: Family Medicine

## 2015-04-04 DIAGNOSIS — E785 Hyperlipidemia, unspecified: Secondary | ICD-10-CM | POA: Insufficient documentation

## 2015-04-04 NOTE — Telephone Encounter (Signed)
Last OV 12/2014 

## 2015-04-19 ENCOUNTER — Ambulatory Visit (INDEPENDENT_AMBULATORY_CARE_PROVIDER_SITE_OTHER): Payer: Medicare Other | Admitting: Family Medicine

## 2015-04-19 ENCOUNTER — Encounter: Payer: Self-pay | Admitting: Family Medicine

## 2015-04-19 VITALS — BP 130/80 | HR 60 | Temp 98.2°F | Resp 16 | Ht 67.0 in | Wt 263.0 lb

## 2015-04-19 DIAGNOSIS — Z1231 Encounter for screening mammogram for malignant neoplasm of breast: Secondary | ICD-10-CM

## 2015-04-19 DIAGNOSIS — Z0000 Encounter for general adult medical examination without abnormal findings: Secondary | ICD-10-CM

## 2015-04-19 NOTE — Progress Notes (Signed)
Patient: Suzanne Faulkner, Female    DOB: 06/17/1941, 74 y.o.   MRN: [id number] Visit Date: 04/19/2015  Today's Provider: Margarita Rana, MD   Chief Complaint  Patient presents with  . Medicare Wellness   Subjective:    Annual wellness visit Suzanne Faulkner is a 73 y.o. female. She feels well. She reports exercising none. She reports she is sleeping fairly well. Still not smoking. Back is doing some better.   Last CPE- 03/24/2012 Last Pap- S/P Hysterectomy Last Mammo- 06/14/2014 BI-RADS 1 Last colonoscopy- 02/08/2013- WNL, Repeat 10 years Last BMD- 03/30/2012- WNL Last EKG- 08/30/2014  Pneumovax- 08/15/2010 Prevnar- 04/28/2014 -----------------------------------------------------------   Review of Systems  HENT: Positive for rhinorrhea.   Endocrine: Positive for polyuria (nocturia).  Musculoskeletal: Positive for back pain.  All other systems reviewed and are negative.   Social History   Social History  . Marital Status: Divorced    Spouse Name: N/A  . Number of Children: N/A  . Years of Education: N/A   Occupational History  . Not on file.   Social History Main Topics  . Smoking status: Former Smoker -- 0.50 packs/day    Types: Cigarettes    Quit date: 01/08/2014  . Smokeless tobacco: Never Used  . Alcohol Use: No  . Drug Use: No  . Sexual Activity: Not on file   Other Topics Concern  . Not on file   Social History Narrative    Patient Active Problem List   Diagnosis Date Noted  . Hyperlipemia 04/04/2015  . Personal history of colonic polyps 02/02/2013  . Encounter for screening colonoscopy 02/01/2013  . Accelerated hypertension 01/26/2013  . TIA (transient ischemic attack) 01/25/2013  . HTN (hypertension) 01/25/2013  . Tobacco abuse 01/25/2013  . DM type 2 (diabetes mellitus, type 2) 01/25/2013  . Bradycardia 01/25/2013  . Acute renal failure 01/25/2013  . UTI (lower urinary tract infection) 01/25/2013    Past Surgical History  Procedure  Laterality Date  . Abdominal hysterectomy    . Cholecystectomy    . Joint replacement Left     knee  . Goiter removal      Her family history includes Cancer in her father and sister; Diabetes in her mother; Heart disease in her mother.    Previous Medications   ASCORBIC ACID (VITAMIN C PO)    Take 1 tablet by mouth every morning.   ASPIRIN 325 MG TABLET    Take 325 mg by mouth daily.   B COMPLEX VITAMINS TABLET    Take 1 tablet by mouth every morning.    BENAZEPRIL (LOTENSIN) 20 MG TABLET    Take 20 mg by mouth every morning.    CALCIUM CARBONATE-VITAMIN D (CALCIUM + D PO)    Take 1 tablet by mouth every morning.    CHOLECALCIFEROL (VITAMIN D) 1000 UNITS TABLET    Take 1,000 Units by mouth every morning.    GLIPIZIDE (GLUCOTROL XL) 2.5 MG 24 HR TABLET    TAKE ONE TABLET BY MOUTH ONCE DAILY   HYDROCHLOROTHIAZIDE (MICROZIDE) 12.5 MG CAPSULE    TAKE ONE CAPSULE BY MOUTH ONCE DAILY   LOVASTATIN (MEVACOR) 20 MG TABLET    TAKE ONE TABLET BY MOUTH AT BEDTIME   MAGNESIUM 300 MG CAPS    Take 1 capsule by mouth daily.   METFORMIN (GLUCOPHAGE) 500 MG TABLET    Take 500 mg by mouth 2 (two) times daily with a meal.   MUSTARD OIL OIL    by  Does not apply route.   OXYCODONE-ACETAMINOPHEN (PERCOCET/ROXICET) 5-325 MG PER TABLET    Take 1 tablet by mouth every 4 (four) hours as needed for severe pain. May take 2 tablets PO q 6 hours for severe pain - Do not take with Tylenol as this tablet already contains tylenol    Patient Care Team: Margarita Rana, MD as PCP - General (Family Medicine) Robert Bellow, MD (General Surgery)     Objective:   Vitals: BP 130/80 mmHg  Pulse 60  Temp(Src) 98.2 F (36.8 C) (Oral)  Resp 16  Ht 5\' 7"  (1.702 m)  Wt 263 lb (119.296 kg)  BMI 41.18 kg/m2  Physical Exam  Constitutional: She is oriented to person, place, and time. She appears well-developed and well-nourished.  HENT:  Head: Normocephalic and atraumatic.  Right Ear: Tympanic membrane, external ear  and ear canal normal.  Left Ear: Tympanic membrane, external ear and ear canal normal.  Nose: Nose normal.  Mouth/Throat: Uvula is midline, oropharynx is clear and moist and mucous membranes are normal.  Eyes: Conjunctivae, EOM and lids are normal. Pupils are equal, round, and reactive to light.  Neck: Trachea normal and normal range of motion. Neck supple. Carotid bruit is not present. No thyroid mass and no thyromegaly present.  Cardiovascular: Normal rate, regular rhythm and normal heart sounds.   Pulmonary/Chest: Effort normal and breath sounds normal.  Abdominal: Soft. Normal appearance and bowel sounds are normal. There is no hepatosplenomegaly. There is no tenderness.  Genitourinary: No breast swelling, tenderness or discharge.  Musculoskeletal: Normal range of motion.  Lymphadenopathy:    She has no cervical adenopathy.    She has no axillary adenopathy.  Neurological: She is alert and oriented to person, place, and time. She has normal strength. No cranial nerve deficit.  Skin: Skin is warm, dry and intact.  Psychiatric: She has a normal mood and affect. Her speech is normal and behavior is normal. Judgment and thought content normal. Cognition and memory are normal.    Activities of Daily Living In your present state of health, do you have any difficulty performing the following activities: 04/19/2015  Hearing? N  Vision? N  Difficulty concentrating or making decisions? N  Walking or climbing stairs? Y  Dressing or bathing? N  Doing errands, shopping? N    Fall Risk Assessment Fall Risk  04/19/2015  Falls in the past year? Yes  Number falls in past yr: 1  Injury with Fall? No     Depression Screen PHQ 2/9 Scores 04/19/2015  PHQ - 2 Score 0    Cognitive Testing - 6-CIT  Correct? Score   What year is it? yes 0 0 or 4  What month is it? yes 0 0 or 3  Memorize:    Suzanne Faulkner,  42,  [place],      What time is it? (within 1 hour) yes 0 0 or 3  Count  backwards from 20 yes 0 0, 2, or 4  Name the months of the year yes 0 0, 2, or 4  Repeat name & address above no 4 0, 2, 4, 6, 8, or 10       TOTAL SCORE  4/28   Interpretation:  Normal  Normal (0-7) Abnormal (8-28)       Assessment & Plan:     Annual Wellness Visit  Reviewed patient's Family Medical History Reviewed and updated list of patient's medical providers Assessment of cognitive impairment was done Assessed  patient's functional ability Established a written schedule for health screening [place] Completed and Reviewed  Pt's labs are UTD.   Exercise Activities and Dietary recommendations Goals    None      Immunization History  Administered Date(s) Administered  . Pneumococcal Conjugate-13 04/28/2014  . Pneumococcal Polysaccharide-23 08/15/2010    Health Maintenance  Topic Date Due  . FOOT EXAM  10/30/1950  . OPHTHALMOLOGY EXAM  10/30/1950  . URINE MICROALBUMIN  10/30/1950  . TETANUS/TDAP  10/30/1959  . MAMMOGRAM  10/30/1990  . ZOSTAVAX  10/29/2000  . DEXA SCAN  10/29/2005  . PNA vac Low Risk Adult (1 of 2 - PCV13) 10/29/2005  . HEMOGLOBIN A1C  07/27/2013  . INFLUENZA VACCINE  03/12/2015  . COLONOSCOPY  02/09/2023      Discussed health benefits of physical activity, and encouraged her to engage in regular exercise appropriate for her age and condition.    ------------------------------------------------------------------------------------------------------------    2. Encounter for screening mammogram for breast cancer FU pending report. - MM DIGITAL SCREENING BILATERAL; Future  Patient seen and examined by Jerrell Belfast, MD, and note scribed by Renaldo Fiddler, CMA.  I have reviewed the document for accuracy and completeness and I agree with above. Jerrell Belfast, MD   Margarita Rana, MD

## 2015-05-24 ENCOUNTER — Ambulatory Visit (INDEPENDENT_AMBULATORY_CARE_PROVIDER_SITE_OTHER): Payer: Medicare Other

## 2015-05-24 DIAGNOSIS — Z23 Encounter for immunization: Secondary | ICD-10-CM

## 2015-06-06 ENCOUNTER — Other Ambulatory Visit: Payer: Self-pay | Admitting: Family Medicine

## 2015-06-06 DIAGNOSIS — E119 Type 2 diabetes mellitus without complications: Secondary | ICD-10-CM

## 2015-06-18 ENCOUNTER — Ambulatory Visit
Admission: RE | Admit: 2015-06-18 | Discharge: 2015-06-18 | Disposition: A | Discharge status: Home / Self Care | Payer: Medicare Other | Source: Ambulatory Visit | Attending: Family Medicine | Admitting: Family Medicine

## 2015-06-18 ENCOUNTER — Other Ambulatory Visit: Payer: Self-pay | Admitting: Family Medicine

## 2015-06-18 DIAGNOSIS — Z1231 Encounter for screening mammogram for malignant neoplasm of breast: Secondary | ICD-10-CM

## 2015-08-28 ENCOUNTER — Encounter: Payer: Self-pay | Admitting: Family Medicine

## 2015-08-28 ENCOUNTER — Ambulatory Visit (INDEPENDENT_AMBULATORY_CARE_PROVIDER_SITE_OTHER): Payer: Medicare Other | Admitting: Family Medicine

## 2015-08-28 VITALS — BP 102/70 | HR 56 | Temp 98.2°F | Resp 16 | Wt 262.0 lb

## 2015-08-28 DIAGNOSIS — I10 Essential (primary) hypertension: Secondary | ICD-10-CM | POA: Diagnosis not present

## 2015-08-28 DIAGNOSIS — E785 Hyperlipidemia, unspecified: Secondary | ICD-10-CM | POA: Diagnosis not present

## 2015-08-28 DIAGNOSIS — E119 Type 2 diabetes mellitus without complications: Secondary | ICD-10-CM

## 2015-08-28 LAB — POCT GLYCOSYLATED HEMOGLOBIN (HGB A1C)
ESTIMATED AVERAGE GLUCOSE: 114
Hemoglobin A1C: 5.6

## 2015-08-28 LAB — POCT UA - MICROALBUMIN: Microalbumin Ur, POC: <20 mg/L

## 2015-08-28 NOTE — Progress Notes (Signed)
Subjective:    Patient ID: Suzanne Faulkner, female    DOB: 1941/08/07, 75 y.o.   MRN: [id number]  Diabetes She presents for her follow-up diabetic visit. She has type 2 (Last A1C was 12/26/2014 and was 6.1%) diabetes mellitus. Hypoglycemia symptoms include sweats (night sweats due to heater). Pertinent negatives for hypoglycemia include no headaches. Pertinent negatives for diabetes include no blurred vision, no chest pain, no fatigue, no foot paresthesias, no foot ulcerations, no polydipsia, no polyphagia, no polyuria, no visual change, no weakness and no weight loss. Risk factors for coronary artery disease include diabetes mellitus, dyslipidemia, hypertension, obesity and post-menopausal. Current diabetic treatment includes oral agent (dual therapy) (Metformin 500 mg BID, Glipizide 2.5 mg). She is compliant with treatment all of the time. She is following a generally healthy diet. She never participates in exercise. There is no change in her home blood glucose trend. An ACE inhibitor/angiotensin II receptor blocker is being taken. Eye exam is current (12/25/2014. Dr. Gloriann Loan).  Hypertension This is a chronic problem. The problem is controlled. Associated symptoms include sweats (night sweats due to heater). Pertinent negatives include no anxiety, blurred vision, chest pain, headaches, malaise/fatigue, neck pain, orthopnea, palpitations, peripheral edema or shortness of breath. Treatments tried: Benazepril 20 mg, HCTZ 12.5 mg. The current treatment provides moderate improvement. There are no compliance problems.   Hyperlipidemia This is a chronic problem. Lipid results: 08/30/2014- Total- 167, Trig- 194, HDL- 59, LDL- 69. Pertinent negatives include no chest pain or shortness of breath. Current antihyperlipidemic treatment includes statins (Lovastatin 20 mg). There are no compliance problems.       Review of Systems  Constitutional: Negative for weight loss, malaise/fatigue and fatigue.  Eyes: Negative  for blurred vision.  Respiratory: Negative for shortness of breath.   Cardiovascular: Negative for chest pain, palpitations and orthopnea.  Endocrine: Negative for polydipsia, polyphagia and polyuria.  Musculoskeletal: Positive for back pain. Negative for neck pain.  Neurological: Negative for weakness and headaches.   BP 102/70 mmHg  Pulse 56  Temp(Src) 98.2 F (36.8 C) (Oral)  Resp 16  Wt 262 lb (118.842 kg)   Patient Active Problem List   Diagnosis Date Noted  . Hyperlipidemia 04/04/2015  . Personal history of colonic polyps 02/02/2013  . Encounter for screening colonoscopy 02/01/2013  . TIA (transient ischemic attack) 01/25/2013  . HTN (hypertension) 01/25/2013  . Tobacco abuse, in remission 01/25/2013  . DM type 2 (diabetes mellitus, type 2) ([place]) 01/25/2013  . Bradycardia 01/25/2013  . UTI (lower urinary tract infection) 01/25/2013   Past Medical History  Diagnosis Date  . Hypertension   . High cholesterol   . Diabetes mellitus   . Colon polyps 2009   Current Outpatient Prescriptions on File Prior to Visit  Medication Sig  . Ascorbic Acid (VITAMIN C PO) Take 1 tablet by mouth every morning.  Marland Kitchen aspirin 325 MG tablet Take 325 mg by mouth daily.  Marland Kitchen b complex vitamins tablet Take 1 tablet by mouth every morning.   . benazepril (LOTENSIN) 20 MG tablet Take 20 mg by mouth every morning.   . Calcium Carbonate-Vitamin D (CALCIUM + D PO) Take 1 tablet by mouth every morning.   . cholecalciferol (VITAMIN D) 1000 UNITS tablet Take 1,000 Units by mouth every morning.   Marland Kitchen glipiZIDE (GLUCOTROL XL) 2.5 MG 24 hr tablet TAKE ONE TABLET BY MOUTH ONCE DAILY  . hydrochlorothiazide (MICROZIDE) 12.5 MG capsule TAKE ONE CAPSULE BY MOUTH ONCE DAILY  . lovastatin (MEVACOR) 20 MG tablet  TAKE ONE TABLET BY MOUTH AT BEDTIME  . Magnesium 300 MG CAPS Take 1 capsule by mouth daily.  . metFORMIN (GLUCOPHAGE) 500 MG tablet TAKE ONE TABLET BY MOUTH TWICE DAILY  . Mustard Oil OIL by Does not apply  route.  Marland Kitchen oxyCODONE-acetaminophen (PERCOCET/ROXICET) 5-325 MG per tablet Take 1 tablet by mouth every 4 (four) hours as needed for severe pain. May take 2 tablets PO q 6 hours for severe pain - Do not take with Tylenol as this tablet already contains tylenol   No current facility-administered medications on file prior to visit.   No Known Allergies Past Surgical History  Procedure Laterality Date  . Abdominal hysterectomy    . Cholecystectomy    . Joint replacement Left     knee  . Goiter removal     Social History   Social History  . Marital Status: Divorced    Spouse Name: N/A  . Number of Children: N/A  . Years of Education: N/A   Occupational History  . Not on file.   Social History Main Topics  . Smoking status: Former Smoker -- 0.50 packs/day    Types: Cigarettes    Quit date: 01/08/2014  . Smokeless tobacco: Never Used  . Alcohol Use: No  . Drug Use: No  . Sexual Activity: Not on file   Other Topics Concern  . Not on file   Social History Narrative   Family History  Problem Relation Age of Onset  . Heart disease Mother   . Diabetes Mother   . Cancer Father   . Cancer Sister     breast      Objective:   Physical Exam  Constitutional: She appears well-developed and well-nourished.  Cardiovascular: Normal rate and regular rhythm.   Pulmonary/Chest: Effort normal and breath sounds normal.  Neurological:  Monofilament normal  Psychiatric: She has a normal mood and affect. Her behavior is normal.   Diabetic Foot Exam - Simple   Simple Foot Form  Diabetic Foot exam was performed with the following findings:  Yes 08/28/2015 12:26 PM  Visual Inspection  No deformities, no ulcerations, no other skin breakdown bilaterally:  Yes  Sensation Testing  Intact to touch and monofilament testing bilaterally:  Yes  Pulse Check  Posterior Tibialis and Dorsalis pulse intact bilaterally:  Yes  Comments     BP 102/70 mmHg  Pulse 56  Temp(Src) 98.2 F (36.8 C)  (Oral)  Resp 16  Wt 262 lb (118.842 kg)     Assessment & Plan:  1. Essential hypertension Stable. Too low today. Will stop HCTZ.    - CBC with Differential/Platelet  2. Type 2 diabetes mellitus without complication, without long-term current use of insulin (HCC) Condition is stable, improved. Has not had any lows, but will stop Glipizide and recheck in 3 months.   - POCT UA - Microalbumin - POCT glycosylated hemoglobin (Hb A1C) - Comprehensive metabolic panel Results for orders placed or performed in visit on 08/28/15  POCT UA - Microalbumin  Result Value Ref Range   Microalbumin Ur, POC <20 mg/L  POCT glycosylated hemoglobin (Hb A1C)  Result Value Ref Range   Hemoglobin A1C 5.6    Est. average glucose Bld gHb Est-mCnc 114     3. Hyperlipidemia Stable. Check labs.  - Lipid panel - TSH   Patient was seen and examined by Jerrell Belfast, MD, and note scribed by Renaldo Fiddler, CMA. I have reviewed the document for accuracy and completeness and I agree  with above. Jerrell Belfast, MD   Margarita Rana, MD

## 2015-08-29 ENCOUNTER — Telehealth: Payer: Self-pay

## 2015-08-29 LAB — CBC WITH DIFFERENTIAL/PLATELET
BASOS: 1 %
Basophils Absolute: 0 10*3/uL (ref 0.0–0.2)
EOS (ABSOLUTE): 0.1 10*3/uL (ref 0.0–0.4)
EOS: 2 %
Hematocrit: 40.9 % (ref 34.0–46.6)
Hemoglobin: 13.4 g/dL (ref 11.1–15.9)
IMMATURE GRANS (ABS): 0 10*3/uL (ref 0.0–0.1)
IMMATURE GRANULOCYTES: 0 %
LYMPHS: 44 %
Lymphocytes Absolute: 2.3 10*3/uL (ref 0.7–3.1)
MCH: 31.2 pg (ref 26.6–33.0)
MCHC: 32.8 g/dL (ref 31.5–35.7)
MCV: 95 fL (ref 79–97)
Monocytes Absolute: 0.3 10*3/uL (ref 0.1–0.9)
Monocytes: 7 %
NEUTROS PCT: 46 %
Neutrophils Absolute: 2.5 10*3/uL (ref 1.4–7.0)
PLATELETS: 268 10*3/uL (ref 150–379)
RBC: 4.29 x10E6/uL (ref 3.77–5.28)
RDW: 13.8 % (ref 12.3–15.4)
WBC: 5.2 10*3/uL (ref 3.4–10.8)

## 2015-08-29 LAB — COMPREHENSIVE METABOLIC PANEL
A/G RATIO: 1.5 (ref 1.1–2.5)
ALT: 9 IU/L (ref 0–32)
AST: 16 IU/L (ref 0–40)
Albumin: 4.1 g/dL (ref 3.5–4.8)
Alkaline Phosphatase: 92 IU/L (ref 39–117)
BUN/Creatinine Ratio: 14 (ref 11–26)
BUN: 19 mg/dL (ref 8–27)
Bilirubin Total: 0.5 mg/dL (ref 0.0–1.2)
CALCIUM: 9.9 mg/dL (ref 8.7–10.3)
CHLORIDE: 103 mmol/L (ref 96–106)
CO2: 27 mmol/L (ref 18–29)
CREATININE: 1.36 mg/dL — AB (ref 0.57–1.00)
GFR calc Af Amer: 44 mL/min/{1.73_m2} — ABNORMAL LOW (ref >59)
GFR, EST NON AFRICAN AMERICAN: 38 mL/min/{1.73_m2} — AB (ref >59)
Globulin, Total: 2.7 g/dL (ref 1.5–4.5)
Glucose: 100 mg/dL — ABNORMAL HIGH (ref 65–99)
POTASSIUM: 4.4 mmol/L (ref 3.5–5.2)
SODIUM: 144 mmol/L (ref 134–144)
TOTAL PROTEIN: 6.8 g/dL (ref 6.0–8.5)

## 2015-08-29 LAB — LIPID PANEL
CHOL/HDL RATIO: 2.8 ratio (ref 0.0–4.4)
CHOLESTEROL TOTAL: 167 mg/dL (ref 100–199)
HDL: 59 mg/dL (ref >39)
LDL Calculated: 82 mg/dL (ref 0–99)
TRIGLYCERIDES: 129 mg/dL (ref 0–149)
VLDL Cholesterol Cal: 26 mg/dL (ref 5–40)

## 2015-08-29 LAB — TSH: TSH: 0.653 u[IU]/mL (ref 0.450–4.500)

## 2015-08-29 NOTE — Telephone Encounter (Signed)
Pt advised.   Thanks,   -Rosemarie Galvis  

## 2015-08-29 NOTE — Telephone Encounter (Signed)
-----   Message from Margarita Rana, MD sent at 08/29/2015 11:12 AM EST ----- Labs stable. Please notify patient. Thanks.

## 2015-09-13 ENCOUNTER — Ambulatory Visit (INDEPENDENT_AMBULATORY_CARE_PROVIDER_SITE_OTHER): Payer: Medicare Other | Admitting: Family Medicine

## 2015-09-13 ENCOUNTER — Encounter: Payer: Self-pay | Admitting: Family Medicine

## 2015-09-13 VITALS — BP 136/82 | HR 56 | Temp 98.9°F | Resp 16 | Wt 273.0 lb

## 2015-09-13 DIAGNOSIS — R350 Frequency of micturition: Secondary | ICD-10-CM

## 2015-09-13 DIAGNOSIS — N3090 Cystitis, unspecified without hematuria: Secondary | ICD-10-CM | POA: Diagnosis not present

## 2015-09-13 LAB — POCT URINALYSIS DIPSTICK
Bilirubin, UA: NEGATIVE
Blood, UA: NEGATIVE
GLUCOSE UA: NEGATIVE
KETONES UA: NEGATIVE
Nitrite, UA: NEGATIVE
SPEC GRAV UA: 1.02
Urobilinogen, UA: 0.2
pH, UA: 6.5

## 2015-09-13 NOTE — Progress Notes (Signed)
Subjective:    Patient ID: Suzanne Faulkner, female    DOB: 12-Jun-1941, 75 y.o.   MRN: [id number]  Urinary Tract Infection  This is a new problem. The current episode started in the past 7 days (x 2 days). The problem has been gradually worsening. The pain is at a severity of 0/10. The patient is experiencing no pain ("itching"). There has been no fever. Associated symptoms include frequency and urgency. Pertinent negatives include no chills, discharge, flank pain, hematuria, hesitancy, nausea, possible pregnancy, sweats or vomiting. She has tried nothing for the symptoms.    Review of Systems  Constitutional: Negative for chills.  Gastrointestinal: Negative for nausea and vomiting.  Genitourinary: Positive for urgency and frequency. Negative for hesitancy, hematuria and flank pain.   BP 136/82 mmHg  Pulse 56  Temp(Src) 98.9 F (37.2 C) (Oral)  Resp 16  Wt 273 lb (123.832 kg)   Patient Active Problem List   Diagnosis Date Noted  . Hyperlipidemia 04/04/2015  . Personal history of colonic polyps 02/02/2013  . Encounter for screening colonoscopy 02/01/2013  . TIA (transient ischemic attack) 01/25/2013  . HTN (hypertension) 01/25/2013  . Tobacco abuse, in remission 01/25/2013  . DM type 2 (diabetes mellitus, type 2) ([place]) 01/25/2013  . Bradycardia 01/25/2013  . UTI (lower urinary tract infection) 01/25/2013   Past Medical History  Diagnosis Date  . Hypertension   . High cholesterol   . Diabetes mellitus   . Colon polyps 2009   Current Outpatient Prescriptions on File Prior to Visit  Medication Sig  . Ascorbic Acid (VITAMIN C PO) Take 1 tablet by mouth every morning.  Marland Kitchen aspirin 325 MG tablet Take 325 mg by mouth daily.  Marland Kitchen b complex vitamins tablet Take 1 tablet by mouth every morning.   . benazepril (LOTENSIN) 20 MG tablet Take 20 mg by mouth every morning.   . Calcium Carbonate-Vitamin D (CALCIUM + D PO) Take 1 tablet by mouth every morning.   . lovastatin (MEVACOR) 20 MG  tablet TAKE ONE TABLET BY MOUTH AT BEDTIME  . metFORMIN (GLUCOPHAGE) 500 MG tablet TAKE ONE TABLET BY MOUTH TWICE DAILY   No current facility-administered medications on file prior to visit.   No Known Allergies Past Surgical History  Procedure Laterality Date  . Abdominal hysterectomy    . Cholecystectomy    . Joint replacement Left     knee  . Goiter removal     Social History   Social History  . Marital Status: Divorced    Spouse Name: N/A  . Number of Children: N/A  . Years of Education: N/A   Occupational History  . Not on file.   Social History Main Topics  . Smoking status: Former Smoker -- 0.50 packs/day    Types: Cigarettes    Quit date: 01/08/2014  . Smokeless tobacco: Never Used  . Alcohol Use: No  . Drug Use: No  . Sexual Activity: Not on file   Other Topics Concern  . Not on file   Social History Narrative   Family History  Problem Relation Age of Onset  . Heart disease Mother   . Diabetes Mother   . Cancer Father   . Cancer Sister     breast      Objective:   Physical Exam  Constitutional: She appears well-developed and well-nourished.  Abdominal: There is no tenderness.  No CVA tenderness  Psychiatric: She has a normal mood and affect. Her behavior is normal.  BP 136/82 mmHg  Pulse 56  Temp(Src) 98.9 F (37.2 C) (Oral)  Resp 16  Wt 273 lb (123.832 kg)     Assessment & Plan:  1. Urinary frequency Suspect related to mild cystitis. Will try to clear with increased fluid intake. Call if worsens or does not improve, especially fever,  Mid-back pain or other symptoms.  - POCT urinalysis dipstick Results for orders placed or performed in visit on 09/13/15  POCT urinalysis dipstick  Result Value Ref Range   Color, UA yellow    Clarity, UA clear    Glucose, UA Negative    Bilirubin, UA Negative    Ketones, UA Negative    Spec Grav, UA 1.020    Blood, UA Negative    pH, UA 6.5    Protein, UA Trace    Urobilinogen, UA 0.2    Nitrite,  UA Negative    Leukocytes, UA Trace (A) Negative    2. Cystitis Will send for culture in case symptoms worsen and needs antibiotic.   - Urine culture   Margarita Rana, MD

## 2015-09-14 LAB — URINE CULTURE

## 2015-10-29 DIAGNOSIS — E113393 Type 2 diabetes mellitus with moderate nonproliferative diabetic retinopathy without macular edema, bilateral: Secondary | ICD-10-CM | POA: Diagnosis not present

## 2015-10-29 LAB — HM DIABETES EYE EXAM

## 2015-11-28 DIAGNOSIS — Z6841 Body Mass Index (BMI) 40.0 and over, adult: Secondary | ICD-10-CM | POA: Diagnosis not present

## 2015-11-28 DIAGNOSIS — M4806 Spinal stenosis, lumbar region: Secondary | ICD-10-CM | POA: Diagnosis not present

## 2015-11-29 DIAGNOSIS — M47816 Spondylosis without myelopathy or radiculopathy, lumbar region: Secondary | ICD-10-CM | POA: Diagnosis not present

## 2015-11-29 DIAGNOSIS — M4806 Spinal stenosis, lumbar region: Secondary | ICD-10-CM | POA: Diagnosis not present

## 2015-12-05 ENCOUNTER — Other Ambulatory Visit: Payer: Self-pay | Admitting: Family Medicine

## 2015-12-05 DIAGNOSIS — E119 Type 2 diabetes mellitus without complications: Secondary | ICD-10-CM

## 2015-12-12 DIAGNOSIS — M4806 Spinal stenosis, lumbar region: Secondary | ICD-10-CM | POA: Diagnosis not present

## 2015-12-12 DIAGNOSIS — Z6841 Body Mass Index (BMI) 40.0 and over, adult: Secondary | ICD-10-CM | POA: Diagnosis not present

## 2015-12-12 DIAGNOSIS — I10 Essential (primary) hypertension: Secondary | ICD-10-CM | POA: Diagnosis not present

## 2015-12-12 DIAGNOSIS — M47817 Spondylosis without myelopathy or radiculopathy, lumbosacral region: Secondary | ICD-10-CM | POA: Diagnosis not present

## 2016-01-01 ENCOUNTER — Encounter: Payer: Self-pay | Admitting: Family Medicine

## 2016-01-01 ENCOUNTER — Ambulatory Visit (INDEPENDENT_AMBULATORY_CARE_PROVIDER_SITE_OTHER): Payer: Medicare Other | Admitting: Family Medicine

## 2016-01-01 VITALS — BP 110/60 | HR 58 | Temp 97.7°F | Resp 14 | Wt 263.0 lb

## 2016-01-01 DIAGNOSIS — E785 Hyperlipidemia, unspecified: Secondary | ICD-10-CM

## 2016-01-01 DIAGNOSIS — I10 Essential (primary) hypertension: Secondary | ICD-10-CM

## 2016-01-01 DIAGNOSIS — E119 Type 2 diabetes mellitus without complications: Secondary | ICD-10-CM

## 2016-01-01 LAB — POCT GLYCOSYLATED HEMOGLOBIN (HGB A1C): HEMOGLOBIN A1C: 6

## 2016-01-01 NOTE — Progress Notes (Signed)
Patient ID: Suzanne Faulkner, female   DOB: Mar 22, 1941, 75 y.o.   MRN: [id number]       Patient: Suzanne Faulkner Female    DOB: 01-24-1941   75 y.o.   MRN: [id number] Visit Date: 01/01/2016  Today's Provider: Margarita Rana, MD   Chief Complaint  Patient presents with  . Diabetes  . Hypertension  . Hyperlipidemia   Subjective:    HPI  Diabetes Mellitus Type II, Follow-up:   Lab Results  Component Value Date   HGBA1C 5.6 08/28/2015   HGBA1C 5.4 01/25/2013   Last seen for diabetes 4 months ago.  Management since then includes none. She reports good compliance with treatment. She is not having side effects.  Current symptoms include none and have been unchanged. Home blood sugar records: 130's usually. (if she remembers)  Episodes of hypoglycemia? no   Current Insulin Regimen: n/a Most Recent Eye Exam: within the year. Current exercise: none due to leg pain.  ------------------------------------------------------------------------   Hypertension, follow-up:  BP Readings from Last 3 Encounters:  01/01/16 110/60  09/13/15 136/82  08/28/15 102/70    She was last seen for hypertension 4 months ago.  BP at that visit was 136/82 . Management since that visit includes stopping HCTZ, due to BP being low. Pt had to start back as her BP was high at another doctors office.She reports good compliance with treatment. She is not having side effects.    Outside blood pressures are not being checked. She is experiencing none.  Patient denies chest pain, chest pressure/discomfort, claudication, dyspnea, exertional chest pressure/discomfort, fatigue, irregular heart beat, lower extremity edema, near-syncope, orthopnea, palpitations and paroxysmal nocturnal dyspnea.   ------------------------------------------------------------------------    Lipid/Cholesterol, Follow-up:   Last seen for this 4 months ago.  Management since that visit includes none.  Last Lipid Panel:    Component  Value Date/Time   CHOL 167 08/28/2015 1247   CHOL 145 01/26/2013 0543   TRIG 129 08/28/2015 1247   HDL 59 08/28/2015 1247   HDL 50 01/26/2013 0543   CHOLHDL 2.8 08/28/2015 1247   CHOLHDL 2.9 01/26/2013 0543   VLDL 25 01/26/2013 0543   LDLCALC 82 08/28/2015 1247   LDLCALC 70 01/26/2013 0543    She reports good compliance with treatment. She is not having side effects.   Wt Readings from Last 3 Encounters:  01/01/16 263 lb (119.296 kg)  09/13/15 273 lb (123.832 kg)  08/28/15 262 lb (118.842 kg)    ------------------------------------------------------------------------       No Known Allergies Previous Medications   ASCORBIC ACID (VITAMIN C PO)    Take 1 tablet by mouth every morning.   ASPIRIN 325 MG TABLET    Take 325 mg by mouth daily.   B COMPLEX VITAMINS TABLET    Take 1 tablet by mouth every morning.    BENAZEPRIL (LOTENSIN) 20 MG TABLET    Take 20 mg by mouth every morning.    CALCIUM CARBONATE-VITAMIN D (CALCIUM + D PO)    Take 1 tablet by mouth every morning.    LOVASTATIN (MEVACOR) 20 MG TABLET    TAKE ONE TABLET BY MOUTH AT BEDTIME   METFORMIN (GLUCOPHAGE) 500 MG TABLET    TAKE ONE TABLET BY MOUTH TWICE DAILY   NAPROXEN SODIUM (ANAPROX) 220 MG TABLET    Take 220 mg by mouth 2 (two) times daily with a meal. Reported on 01/01/2016    Review of Systems  Constitutional: Negative.   HENT: Negative.   Eyes: Negative.  Respiratory: Negative.   Cardiovascular: Negative.   Gastrointestinal: Negative.   Endocrine: Negative.   Genitourinary: Negative.   Musculoskeletal: Negative.        Leg pain (chronic issue for pt)  Skin: Negative.   Allergic/Immunologic: Negative.   Neurological: Negative.   Hematological: Negative.   Psychiatric/Behavioral: Negative.     Social History  Substance Use Topics  . Smoking status: Former Smoker -- 0.50 packs/day    Types: Cigarettes    Quit date: 01/08/2014  . Smokeless tobacco: Never Used  . Alcohol Use: No    Objective:   BP 110/60 mmHg  Pulse 58  Temp(Src) 97.7 F (36.5 C) (Oral)  Resp 14  Wt 263 lb (119.296 kg)  Physical Exam  Constitutional: She appears well-developed and well-nourished.  Cardiovascular: Normal rate, regular rhythm, normal heart sounds and intact distal pulses.   Pulmonary/Chest: Effort normal and breath sounds normal.  Skin: Skin is warm and dry.  Psychiatric: She has a normal mood and affect. Her behavior is normal. Judgment and thought content normal.        Assessment & Plan:     1. Type 2 diabetes mellitus without complication, without long-term current use of insulin (HCC) Stable.  - POCT HgB A1C Results for orders placed or performed in visit on 01/01/16  POCT HgB A1C  Result Value Ref Range   Hemoglobin A1C 6.0     2. Essential hypertension Stable. Started back on HCTZ. Continue current medication and plan of care.    3. Hyperlipidemia Stable. Continue current medication. Recheck labs in winter.       Patient was seen and examined by Dr.Ridge Lafond Venia Minks, and noted scribed by Webb Laws, [place]. I have reviewed the document for accuracy and completeness and I agree with above. - Jerrell Belfast, MD   Margarita Rana, MD  [facility]

## 2016-01-16 DIAGNOSIS — F4542 Pain disorder with related psychological factors: Secondary | ICD-10-CM | POA: Diagnosis not present

## 2016-01-17 DIAGNOSIS — F4542 Pain disorder with related psychological factors: Secondary | ICD-10-CM | POA: Diagnosis not present

## 2016-02-21 DIAGNOSIS — M47817 Spondylosis without myelopathy or radiculopathy, lumbosacral region: Secondary | ICD-10-CM | POA: Diagnosis not present

## 2016-02-21 DIAGNOSIS — Z6841 Body Mass Index (BMI) 40.0 and over, adult: Secondary | ICD-10-CM | POA: Diagnosis not present

## 2016-02-21 DIAGNOSIS — M4806 Spinal stenosis, lumbar region: Secondary | ICD-10-CM | POA: Diagnosis not present

## 2016-02-28 DIAGNOSIS — I10 Essential (primary) hypertension: Secondary | ICD-10-CM | POA: Diagnosis not present

## 2016-02-28 DIAGNOSIS — E1142 Type 2 diabetes mellitus with diabetic polyneuropathy: Secondary | ICD-10-CM | POA: Diagnosis not present

## 2016-02-28 DIAGNOSIS — Z6841 Body Mass Index (BMI) 40.0 and over, adult: Secondary | ICD-10-CM | POA: Diagnosis not present

## 2016-02-28 DIAGNOSIS — M4806 Spinal stenosis, lumbar region: Secondary | ICD-10-CM | POA: Diagnosis not present

## 2016-02-28 DIAGNOSIS — M545 Low back pain: Secondary | ICD-10-CM | POA: Diagnosis not present

## 2016-02-28 DIAGNOSIS — M47817 Spondylosis without myelopathy or radiculopathy, lumbosacral region: Secondary | ICD-10-CM | POA: Diagnosis not present

## 2016-03-03 ENCOUNTER — Other Ambulatory Visit: Payer: Self-pay | Admitting: Family Medicine

## 2016-03-03 DIAGNOSIS — I10 Essential (primary) hypertension: Secondary | ICD-10-CM

## 2016-03-05 ENCOUNTER — Other Ambulatory Visit: Payer: Self-pay | Admitting: Anesthesiology

## 2016-03-18 ENCOUNTER — Encounter (HOSPITAL_COMMUNITY): Payer: Self-pay

## 2016-03-18 NOTE — Pre-Procedure Instructions (Signed)
Suzanne Faulkner  03/18/2016      [facility] [address], [place] - [place] [address] Phone: [phone] Fax: [phone]    Your procedure is scheduled on Friday, August 11.  Report to [facility] Admitting at 5:30 A.M.  Call this number if you have problems the morning of surgery:  [phone]   Remember:  Do not eat food or drink liquids after midnight.  Take these medicines the morning of surgery with A SIP OF WATER: NONE  STOP taking today: any Aspirin, Aleve, Naproxen, Ibuprofen, Motrin, Advil, Goody's, BC's, all herbal medications, fish oil, and all vitamins  WHAT DO I DO ABOUT MY DIABETES MEDICATION?   Marland Kitchen Do not take oral diabetes medicines (pills) the morning of surgery. DO NOT take metformin (Glucophage) the day of surgery .     How to Manage Your Diabetes Before and After Surgery  Why is it important to control my blood sugar before and after surgery? . Improving blood sugar levels before and after surgery helps healing and can limit problems. . A way of improving blood sugar control is eating a healthy diet by: o  Eating less sugar and carbohydrates o  Increasing activity/exercise o  Talking with your doctor about reaching your blood sugar goals . High blood sugars (greater than 180 mg/dL) can raise your risk of infections and slow your recovery, so you will need to focus on controlling your diabetes during the weeks before surgery. . Make sure that the doctor who takes care of your diabetes knows about your planned surgery including the date and location.  How do I manage my blood sugar before surgery? . Check your blood sugar at least 4 times a day, starting 2 days before surgery, to make sure that the level is not too high or low. o Check your blood sugar the morning of your surgery when you wake up and every 2 hours until you get to the Short Stay unit. . If your blood sugar is less than 70 mg/dL, you  will need to treat for low blood sugar: o Do not take insulin. o Treat a low blood sugar (less than 70 mg/dL) with  cup of clear juice (cranberry or apple), 4 glucose tablets, OR glucose gel. o Recheck blood sugar in 15 minutes after treatment (to make sure it is greater than 70 mg/dL). If your blood sugar is not greater than 70 mg/dL on recheck, call [phone] for further instructions. . Report your blood sugar to the short stay nurse when you get to Short Stay.  . If you are admitted to the hospital after surgery: o Your blood sugar will be checked by the staff and you will probably be given insulin after surgery (instead of oral diabetes medicines) to make sure you have good blood sugar levels. o The goal for blood sugar control after surgery is 80-180 mg/dL.           Do not wear jewelry, make-up or nail polish.  Do not wear lotions, powders, or perfumes.  You may wear deoderant.  Do not shave 48 hours prior to surgery.  Men may shave face and neck.  Do not bring valuables to the hospital.  [facility] is not responsible for any belongings or valuables.  Contacts, dentures or bridgework may not be worn into surgery.  Leave your suitcase in the car.  After surgery it may be brought to your room.  For patients  admitted to the hospital, discharge time will be determined by your treatment team.  Patients discharged the day of surgery will not be allowed to drive home.   Special instructions:    [facility]- Preparing For Surgery  Before surgery, you can play an important role. Because skin is not sterile, your skin needs to be as free of germs as possible. You can reduce the number of germs on your skin by washing with CHG (chlorahexidine gluconate) Soap before surgery.  CHG is an antiseptic cleaner which kills germs and bonds with the skin to continue killing germs even after washing.  Please do not use if you have an allergy to CHG or antibacterial soaps. If your skin  becomes reddened/irritated stop using the CHG.  Do not shave (including legs and underarms) for at least 48 hours prior to first CHG shower. It is OK to shave your face.  Please follow these instructions carefully.   1. Shower the NIGHT BEFORE SURGERY and the MORNING OF SURGERY with CHG.   2. If you chose to wash your hair, wash your hair first as usual with your normal shampoo.  3. After you shampoo, rinse your hair and body thoroughly to remove the shampoo.  4. Use CHG as you would any other liquid soap. You can apply CHG directly to the skin and wash gently with a scrungie or a clean washcloth.   5. Apply the CHG Soap to your body ONLY FROM THE NECK DOWN.  Do not use on open wounds or open sores. Avoid contact with your eyes, ears, mouth and genitals (private parts). Wash genitals (private parts) with your normal soap.  6. Wash thoroughly, paying special attention to the area where your surgery will be performed.  7. Thoroughly rinse your body with warm water from the neck down.  8. DO NOT shower/wash with your normal soap after using and rinsing off the CHG Soap.  9. Pat yourself dry with a CLEAN TOWEL.   10. Wear CLEAN PAJAMAS   11. Place CLEAN SHEETS on your bed the night of your first shower and DO NOT SLEEP WITH PETS.    Day of Surgery: Do not apply any deodorants/lotions. Please wear clean clothes to the hospital/surgery center.      Please read over the following fact sheets that you were given. MRSA Information

## 2016-03-19 ENCOUNTER — Encounter (HOSPITAL_COMMUNITY): Payer: Self-pay

## 2016-03-19 ENCOUNTER — Encounter (HOSPITAL_COMMUNITY)
Admission: RE | Admit: 2016-03-19 | Discharge: 2016-03-19 | Disposition: A | Discharge status: Home / Self Care | Payer: Medicare Other | Source: Ambulatory Visit | Attending: Anesthesiology | Admitting: Anesthesiology

## 2016-03-19 DIAGNOSIS — E119 Type 2 diabetes mellitus without complications: Secondary | ICD-10-CM | POA: Diagnosis not present

## 2016-03-19 DIAGNOSIS — Z8673 Personal history of transient ischemic attack (TIA), and cerebral infarction without residual deficits: Secondary | ICD-10-CM | POA: Diagnosis not present

## 2016-03-19 DIAGNOSIS — E7800 Pure hypercholesterolemia, unspecified: Secondary | ICD-10-CM | POA: Diagnosis not present

## 2016-03-19 DIAGNOSIS — Z79899 Other long term (current) drug therapy: Secondary | ICD-10-CM | POA: Diagnosis not present

## 2016-03-19 DIAGNOSIS — Z7982 Long term (current) use of aspirin: Secondary | ICD-10-CM | POA: Diagnosis not present

## 2016-03-19 DIAGNOSIS — Z6841 Body Mass Index (BMI) 40.0 and over, adult: Secondary | ICD-10-CM | POA: Diagnosis not present

## 2016-03-19 DIAGNOSIS — M4726 Other spondylosis with radiculopathy, lumbar region: Secondary | ICD-10-CM | POA: Diagnosis not present

## 2016-03-19 DIAGNOSIS — G894 Chronic pain syndrome: Secondary | ICD-10-CM | POA: Diagnosis not present

## 2016-03-19 DIAGNOSIS — Z96652 Presence of left artificial knee joint: Secondary | ICD-10-CM | POA: Diagnosis not present

## 2016-03-19 DIAGNOSIS — Z87891 Personal history of nicotine dependence: Secondary | ICD-10-CM | POA: Diagnosis not present

## 2016-03-19 DIAGNOSIS — Z7984 Long term (current) use of oral hypoglycemic drugs: Secondary | ICD-10-CM | POA: Diagnosis not present

## 2016-03-19 DIAGNOSIS — I10 Essential (primary) hypertension: Secondary | ICD-10-CM | POA: Diagnosis not present

## 2016-03-19 HISTORY — DX: Nocturia: R35.1

## 2016-03-19 LAB — CBC
HEMATOCRIT: 44.1 % (ref 36.0–46.0)
Hemoglobin: 14.2 g/dL (ref 12.0–15.0)
MCH: 31.1 pg (ref 26.0–34.0)
MCHC: 32.2 g/dL (ref 30.0–36.0)
MCV: 96.5 fL (ref 78.0–100.0)
PLATELETS: 235 10*3/uL (ref 150–400)
RBC: 4.57 MIL/uL (ref 3.87–5.11)
RDW: 13.1 % (ref 11.5–15.5)
WBC: 6 10*3/uL (ref 4.0–10.5)

## 2016-03-19 LAB — BASIC METABOLIC PANEL
Anion gap: 7 (ref 5–15)
BUN: 11 mg/dL (ref 6–20)
CHLORIDE: 106 mmol/L (ref 101–111)
CO2: 28 mmol/L (ref 22–32)
CREATININE: 1.39 mg/dL — AB (ref 0.44–1.00)
Calcium: 9.7 mg/dL (ref 8.9–10.3)
GFR calc Af Amer: 42 mL/min — ABNORMAL LOW (ref >60)
GFR calc non Af Amer: 36 mL/min — ABNORMAL LOW (ref >60)
GLUCOSE: 101 mg/dL — AB (ref 65–99)
Potassium: 4.2 mmol/L (ref 3.5–5.1)
Sodium: 141 mmol/L (ref 135–145)

## 2016-03-19 LAB — SURGICAL PCR SCREEN
MRSA, PCR: NEGATIVE
Staphylococcus aureus: NEGATIVE

## 2016-03-19 LAB — GLUCOSE, CAPILLARY: Glucose-Capillary: 121 mg/dL — ABNORMAL HIGH (ref 65–99)

## 2016-03-19 NOTE — Progress Notes (Signed)
   03/19/16 1135  OBSTRUCTIVE SLEEP APNEA  Have you ever been diagnosed with sleep apnea through a sleep study? No  Do you snore loudly (loud enough to be heard through closed doors)?  1  Do you often feel tired, fatigued, or sleepy during the daytime (such as falling asleep during driving or talking to someone)? 0  Has anyone observed you stop breathing during your sleep? 0  Do you have, or are you being treated for high blood pressure? 1  BMI more than 35 kg/m2? 1  Age > 50 (1-yes) 1  Neck circumference greater than:Female 16 inches or larger, Female 17inches or larger? 1  Female Gender (Yes=1) 0  Obstructive Sleep Apnea Score 5  Score 5 or greater  Results sent to PCP

## 2016-03-19 NOTE — Progress Notes (Signed)
PCP: Lelon Huh Pt denies cardiologist or cardiac history.   Pt with no chest pain, shortness of breath.  Pt states surgeon told her to stop taking ASA on 8/4

## 2016-03-20 LAB — HEMOGLOBIN A1C
Hgb A1c MFr Bld: 6 % — ABNORMAL HIGH (ref 4.8–5.6)
Mean Plasma Glucose: 126 mg/dL

## 2016-03-20 MED ORDER — CEFAZOLIN SODIUM-DEXTROSE 2-4 GM/100ML-% IV SOLN
2.0000 g | INTRAVENOUS | Status: AC
  Administered 2016-03-21: 2 g via INTRAVENOUS
  Filled 2016-03-20: qty 100

## 2016-03-20 NOTE — H&P (Signed)
Suzanne Faulkner is an 75 y.o. female.   Chief Complaint: back pain with radiation into the lower extremities right>left HPI: 75 year old right-handed woman, with approximately 4 year history of back pain, radiation into the lower extremities right greater than left. Initially evaluated by Dr. Cyndy Freeze, imaging revealed 3 level mild to moderate degenerative change, without overt stenosis or neural compression.  She initially did well with conservative care that included injections, but more recently had limited benefit.  She does not tolerate medications very well.  Repeat imaging has revealed no particular worsening of her multilevel disease.  She considered SCS, and underwent a successful trial recently. Amazingly, she reported that the SCS trial eliminated virtually all of her pain.  She was able to ambulate much more readily.  She now presents for permanent implantation.  Past Medical History:  Diagnosis Date  . Colon polyps 2009  . Diabetes mellitus   . High cholesterol   . Hypertension   . Nocturia   . Urinary frequency     Past Surgical History:  Procedure Laterality Date  . ABDOMINAL HYSTERECTOMY    . CHOLECYSTECTOMY    . goiter removal    . JOINT REPLACEMENT Left    knee  . TONSILLECTOMY      Family History  Problem Relation Age of Onset  . Heart disease Mother   . Diabetes Mother   . Cancer Father   . Cancer Sister     breast   Social History:  reports that she quit smoking about 2 years ago. Her smoking use included Cigarettes. She smoked 0.50 packs per day. She has never used smokeless tobacco. She reports that she does not drink alcohol or use drugs.  Allergies:  Allergies  Allergen Reactions  . No Known Allergies Other (See Comments)    Medications Prior to Admission  Medication Sig Dispense Refill  . Ascorbic Acid (VITAMIN C PO) Take 1 tablet by mouth every morning.    Marland Kitchen aspirin 325 MG tablet Take 325 mg by mouth daily.    Marland Kitchen b complex vitamins tablet Take 1 tablet  by mouth every morning.     . benazepril (LOTENSIN) 10 MG tablet TAKE ONE TABLET BY MOUTH ONCE DAILY (Patient taking differently: TAKE 10 MG BY MOUTH ONCE DAILY) 90 tablet 1  . Calcium Carbonate-Vitamin D (CALCIUM + D PO) Take 1 tablet by mouth every morning.     . hydrochlorothiazide (HYDRODIURIL) 12.5 MG tablet Take 12.5 mg by mouth daily.    Marland Kitchen lovastatin (MEVACOR) 20 MG tablet TAKE ONE TABLET BY MOUTH AT BEDTIME (Patient taking differently: TAKE 20 MG BY MOUTH AT BEDTIME) 90 tablet 3  . metFORMIN (GLUCOPHAGE) 500 MG tablet TAKE ONE TABLET BY MOUTH TWICE DAILY (Patient taking differently: TAKE 500 MG BY MOUTH TWICE DAILY) 180 tablet 1  . naproxen sodium (ANAPROX) 220 MG tablet Take 220 mg by mouth as needed (for pain). Reported on 01/01/2016      Results for orders placed or performed during the hospital encounter of 03/19/16 (from the past 48 hour(s))  Glucose, capillary     Status: Abnormal   Collection Time: 03/19/16 11:05 AM  Result Value Ref Range   Glucose-Capillary 121 (H) 65 - 99 mg/dL  Surgical pcr screen     Status: None   Collection Time: 03/19/16 11:57 AM  Result Value Ref Range   MRSA, PCR NEGATIVE NEGATIVE   Staphylococcus aureus NEGATIVE NEGATIVE    Comment:        The Xpert SA  Assay (FDA approved for NASAL specimens in patients over 75 years of age), is one component of a comprehensive surveillance program.  Test performance has been validated by [facility] for patients greater than or equal to 73 year old. It is not intended to diagnose infection nor to guide or monitor treatment.   Hemoglobin A1c     Status: Abnormal   Collection Time: 03/19/16 12:11 PM  Result Value Ref Range   Hgb A1c MFr Bld 6.0 (H) 4.8 - 5.6 %    Comment: (NOTE)         Pre-diabetes: 5.7 - 6.4         Diabetes: >6.4         Glycemic control for adults with diabetes: <7.0    Mean Plasma Glucose 126 mg/dL    Comment: (NOTE) Performed At: [facility] [place], [place] [id number] Lindon Romp MD QQ:[id number]   Basic metabolic panel     Status: Abnormal   Collection Time: 03/19/16 12:11 PM  Result Value Ref Range   Sodium 141 135 - 145 mmol/L   Potassium 4.2 3.5 - 5.1 mmol/L   Chloride 106 101 - 111 mmol/L   CO2 28 22 - 32 mmol/L   Glucose, Bld 101 (H) 65 - 99 mg/dL   BUN 11 6 - 20 mg/dL   Creatinine, Ser 1.39 (H) 0.44 - 1.00 mg/dL   Calcium 9.7 8.9 - 10.3 mg/dL   GFR calc non Af Amer 36 (L) >60 mL/min   GFR calc Af Amer 42 (L) >60 mL/min    Comment: (NOTE) The eGFR has been calculated using the CKD EPI equation. This calculation has not been validated in all clinical situations. eGFR's persistently <60 mL/min signify possible Chronic Kidney Disease.    Anion gap 7 5 - 15  CBC     Status: None   Collection Time: 03/19/16 12:11 PM  Result Value Ref Range   WBC 6.0 4.0 - 10.5 K/uL   RBC 4.57 3.87 - 5.11 MIL/uL   Hemoglobin 14.2 12.0 - 15.0 g/dL   HCT 44.1 36.0 - 46.0 %   MCV 96.5 78.0 - 100.0 fL   MCH 31.1 26.0 - 34.0 pg   MCHC 32.2 30.0 - 36.0 g/dL   RDW 13.1 11.5 - 15.5 %   Platelets 235 150 - 400 K/uL   No results found.  Review of Systems  Constitutional: Negative.   HENT: Negative.   Eyes: Negative.   Respiratory: Negative.   Cardiovascular: Negative.   Gastrointestinal: Negative.   Genitourinary: Negative.   Musculoskeletal: Positive for back pain. Negative for falls and myalgias.  Skin: Negative.   Neurological: Negative.   Endo/Heme/Allergies: Negative.   Psychiatric/Behavioral: Negative.     Blood pressure (!) 152/86, pulse (!) 57, temperature 99 F (37.2 C), temperature source Oral, resp. rate 20, height _0  (1.676 m), weight 117 kg (258 lb), SpO2 100 %. Physical Exam  Constitutional: She is oriented to person, place, and time. She appears well-developed and well-nourished.  HENT:  Head: Normocephalic and atraumatic.  Eyes: EOM are normal. Pupils are equal, round, and reactive to light.  Neck:  Normal range of motion.  Cardiovascular: Normal rate.   Musculoskeletal: Normal range of motion.  Neurological: She is alert and oriented to person, place, and time.  Skin: Skin is warm and dry.  Psychiatric: She has a normal mood and affect. Her behavior is normal. Judgment and thought content normal.  Assessment/Plan 1) Multilevel spondylosis of the lumbar spine 2) chronic pain syndrome  PLAN: Permanent implantation of SCS, [employer]  Bonna Gains, MD 03/21/2016, 7:11 AM

## 2016-03-20 NOTE — Anesthesia Preprocedure Evaluation (Addendum)
Anesthesia Evaluation  Patient identified by MRN, date of birth, ID band Patient awake    Reviewed: Allergy & Precautions, NPO status , Patient's Chart, lab work & pertinent test results  Airway Mallampati: II  TM Distance: >3 FB Neck ROM: Full    Dental  (+) Edentulous Upper, Edentulous Lower, Dental Advisory Given   Pulmonary former smoker,    breath sounds clear to auscultation       Cardiovascular hypertension, Pt. on medications  Rhythm:Regular Rate:Normal     Neuro/Psych TIA   GI/Hepatic negative GI ROS, Neg liver ROS,   Endo/Other  diabetes, Well Controlled, Type 2, Oral Hypoglycemic AgentsMorbid obesity  Renal/GU negative Renal ROS     Musculoskeletal   Abdominal   Peds  Hematology negative hematology ROS (+)   Anesthesia Other Findings   Reproductive/Obstetrics                           Lab Results  Component Value Date   WBC 6.0 03/19/2016   HGB 14.2 03/19/2016   HCT 44.1 03/19/2016   MCV 96.5 03/19/2016   PLT 235 03/19/2016   Lab Results  Component Value Date   CREATININE 1.39 (H) 03/19/2016   BUN 11 03/19/2016   NA 141 03/19/2016   K 4.2 03/19/2016   CL 106 03/19/2016   CO2 28 03/19/2016    Anesthesia Physical Anesthesia Plan  ASA: III  Anesthesia Plan: MAC   Post-op Pain Management:    Induction: Intravenous  Airway Management Planned: Simple Face Mask and Natural Airway  Additional Equipment:   Intra-op Plan:   Post-operative Plan:   Informed Consent: I have reviewed the patients History and Physical, chart, labs and discussed the procedure including the risks, benefits and alternatives for the proposed anesthesia with the patient or authorized representative who has indicated his/her understanding and acceptance.     Plan Discussed with: CRNA  Anesthesia Plan Comments:        Anesthesia Quick Evaluation

## 2016-03-20 NOTE — Progress Notes (Signed)
Anesthesia Chart Review: Patient is a 75 year old female scheduled for lumbar spinal cord stimulator insertion on 03/21/16 by Dr. Maryjean Ka.  History includes HTN, hypercholesterolemia, former smoker (quit 2015), DM2, hysterectomy, cholecystectomy, tonsillectomy, left TKA, thyroid surgery (goiter removed). BMI is consistent with morbid obesity.   PCP is listed as Dr. Lelon Huh, but last few visits have been with Dr. Margarita Rana.  Meds include aspirin 325 mg (on hold), benazepril, HCTZ, lovastatin, metformin.  03/19/16 EKG: SB at 55 bpm, LAD, cannot rule out inferior infarct (age undetermined). No significant change since last tracing 01/26/13 (and also 08/08/08).  01/26/13 Echo: Study Conclusions - Left ventricle: The cavity size was normal. Wall thickness was increased in a pattern of mild LVH. Systolic function was normal. The estimated ejection fraction was in the range of 55% to 60%. Wall motion was normal; there were no regional wall motion abnormalities. Doppler parameters are consistent with abnormal left ventricular relaxation (grade 1 diastolic dysfunction). - Aortic valve: There was no stenosis. - Aorta: Borderline dilated aortic root. Aortic root dimension: 44mm (ED). - Right ventricle: The cavity size was normal. Systolic function was normal. - Tricuspid valve: Peak RV-RA gradient: 64mm Hg (S). - Pulmonary arteries: PA peak pressure: 56mm Hg (S). - Inferior vena cava: The vessel was normal in size; the respirophasic diameter changes were in the normal range (= 50%); findings are consistent with normal central venous pressure. Impressions: - Normal LV size with mild LV hypertrophy. EF 55-60%. Normal RV size and systolic function. No significant valvular abnormalities.  01/26/13 Carotid U/S: Summary: - The vertebral arteries appear patent with antegrade flow. - Findings consistent with less than 39 percent stenosis involving the right internal  carotid artery and the left internal carotid artery. - ICA/CCA ratio = right = 0.89. left = 0.66.  Preoperative labs noted. Cr 1.39, stable since 01/2013. Glucose 101. A1c 6.0. CBC WNL.  EKG and labs stable. If no acute changes then I anticipate that she can proceed as planned.  [place], [facility] Short Stay Center/Anesthesiology Phone [phone] 03/20/2016 9:23 AM

## 2016-03-21 ENCOUNTER — Encounter (HOSPITAL_COMMUNITY): Payer: Self-pay | Admitting: *Deleted

## 2016-03-21 ENCOUNTER — Ambulatory Visit (HOSPITAL_COMMUNITY): Payer: Medicare Other | Admitting: Certified Registered Nurse Anesthetist

## 2016-03-21 ENCOUNTER — Ambulatory Visit (HOSPITAL_COMMUNITY)
Admission: RE | Admit: 2016-03-21 | Discharge: 2016-03-21 | Disposition: A | Discharge status: Home / Self Care | Payer: Medicare Other | Source: Ambulatory Visit | Attending: Anesthesiology | Admitting: Anesthesiology

## 2016-03-21 ENCOUNTER — Ambulatory Visit (HOSPITAL_COMMUNITY): Payer: Medicare Other

## 2016-03-21 ENCOUNTER — Ambulatory Visit (HOSPITAL_COMMUNITY): Payer: Medicare Other | Admitting: Vascular Surgery

## 2016-03-21 ENCOUNTER — Encounter (HOSPITAL_COMMUNITY)
Admission: RE | Disposition: A | Discharge status: Home / Self Care | Payer: Self-pay | Source: Ambulatory Visit | Attending: Anesthesiology

## 2016-03-21 DIAGNOSIS — Z7984 Long term (current) use of oral hypoglycemic drugs: Secondary | ICD-10-CM | POA: Insufficient documentation

## 2016-03-21 DIAGNOSIS — Z87891 Personal history of nicotine dependence: Secondary | ICD-10-CM | POA: Insufficient documentation

## 2016-03-21 DIAGNOSIS — Z96652 Presence of left artificial knee joint: Secondary | ICD-10-CM | POA: Insufficient documentation

## 2016-03-21 DIAGNOSIS — Z7982 Long term (current) use of aspirin: Secondary | ICD-10-CM | POA: Diagnosis not present

## 2016-03-21 DIAGNOSIS — M4726 Other spondylosis with radiculopathy, lumbar region: Secondary | ICD-10-CM | POA: Insufficient documentation

## 2016-03-21 DIAGNOSIS — M47817 Spondylosis without myelopathy or radiculopathy, lumbosacral region: Secondary | ICD-10-CM | POA: Diagnosis not present

## 2016-03-21 DIAGNOSIS — E7800 Pure hypercholesterolemia, unspecified: Secondary | ICD-10-CM | POA: Insufficient documentation

## 2016-03-21 DIAGNOSIS — Z79899 Other long term (current) drug therapy: Secondary | ICD-10-CM | POA: Insufficient documentation

## 2016-03-21 DIAGNOSIS — Z462 Encounter for fitting and adjustment of other devices related to nervous system and special senses: Secondary | ICD-10-CM | POA: Diagnosis not present

## 2016-03-21 DIAGNOSIS — I10 Essential (primary) hypertension: Secondary | ICD-10-CM | POA: Diagnosis not present

## 2016-03-21 DIAGNOSIS — G894 Chronic pain syndrome: Secondary | ICD-10-CM | POA: Diagnosis not present

## 2016-03-21 DIAGNOSIS — E119 Type 2 diabetes mellitus without complications: Secondary | ICD-10-CM | POA: Diagnosis not present

## 2016-03-21 DIAGNOSIS — E1142 Type 2 diabetes mellitus with diabetic polyneuropathy: Secondary | ICD-10-CM | POA: Diagnosis not present

## 2016-03-21 DIAGNOSIS — M4806 Spinal stenosis, lumbar region: Secondary | ICD-10-CM | POA: Diagnosis not present

## 2016-03-21 DIAGNOSIS — Z8673 Personal history of transient ischemic attack (TIA), and cerebral infarction without residual deficits: Secondary | ICD-10-CM | POA: Insufficient documentation

## 2016-03-21 DIAGNOSIS — Z419 Encounter for procedure for purposes other than remedying health state, unspecified: Secondary | ICD-10-CM

## 2016-03-21 DIAGNOSIS — Z6841 Body Mass Index (BMI) 40.0 and over, adult: Secondary | ICD-10-CM | POA: Insufficient documentation

## 2016-03-21 HISTORY — PX: SPINAL CORD STIMULATOR INSERTION: SHX5378

## 2016-03-21 LAB — GLUCOSE, CAPILLARY
Glucose-Capillary: 135 mg/dL — ABNORMAL HIGH (ref 65–99)
Glucose-Capillary: 106 mg/dL — ABNORMAL HIGH (ref 65–99)

## 2016-03-21 SURGERY — INSERTION, SPINAL CORD STIMULATOR, LUMBAR
Anesthesia: Monitor Anesthesia Care

## 2016-03-21 MED ORDER — BUPIVACAINE-EPINEPHRINE (PF) 0.5% -1:200000 IJ SOLN
INTRAMUSCULAR | Status: DC | PRN
  Administered 2016-03-21: 38 mL via PERINEURAL

## 2016-03-21 MED ORDER — ONDANSETRON HCL 4 MG/2ML IJ SOLN
INTRAMUSCULAR | Status: DC | PRN
  Administered 2016-03-21: 4 mg via INTRAVENOUS

## 2016-03-21 MED ORDER — LIDOCAINE HCL (CARDIAC) 20 MG/ML IV SOLN
INTRAVENOUS | Status: DC | PRN
  Administered 2016-03-21: 60 mg via INTRATRACHEAL

## 2016-03-21 MED ORDER — PHENYLEPHRINE 40 MCG/ML (10ML) SYRINGE FOR IV PUSH (FOR BLOOD PRESSURE SUPPORT)
PREFILLED_SYRINGE | INTRAVENOUS | Status: AC
  Filled 2016-03-21: qty 10

## 2016-03-21 MED ORDER — HYDROCODONE-ACETAMINOPHEN 10-325 MG PO TABS: 1.0000 | ORAL_TABLET | ORAL | 0 refills | Status: DC | PRN

## 2016-03-21 MED ORDER — ONDANSETRON HCL 4 MG/2ML IJ SOLN
INTRAMUSCULAR | Status: AC
  Filled 2016-03-21: qty 2

## 2016-03-21 MED ORDER — SODIUM CHLORIDE 0.9 % IR SOLN
Status: DC | PRN
  Administered 2016-03-21: 08:00:00

## 2016-03-21 MED ORDER — FENTANYL CITRATE (PF) 100 MCG/2ML IJ SOLN
INTRAMUSCULAR | Status: DC | PRN
  Administered 2016-03-21 (×3): 25 ug via INTRAVENOUS

## 2016-03-21 MED ORDER — LIDOCAINE 2% (20 MG/ML) 5 ML SYRINGE
INTRAMUSCULAR | Status: AC
  Filled 2016-03-21: qty 5

## 2016-03-21 MED ORDER — PROPOFOL 10 MG/ML IV BOLUS
INTRAVENOUS | Status: AC
  Filled 2016-03-21: qty 20

## 2016-03-21 MED ORDER — PHENYLEPHRINE HCL 10 MG/ML IJ SOLN
INTRAMUSCULAR | Status: DC | PRN
  Administered 2016-03-21: 40 ug via INTRAVENOUS

## 2016-03-21 MED ORDER — PROMETHAZINE HCL 25 MG/ML IJ SOLN: 6.2500 mg | INTRAMUSCULAR | Status: DC | PRN

## 2016-03-21 MED ORDER — FENTANYL CITRATE (PF) 100 MCG/2ML IJ SOLN: 25.0000 ug | INTRAMUSCULAR | Status: DC | PRN

## 2016-03-21 MED ORDER — CHLORHEXIDINE GLUCONATE CLOTH 2 % EX PADS: 6.0000 | MEDICATED_PAD | Freq: Once | CUTANEOUS | Status: DC

## 2016-03-21 MED ORDER — LACTATED RINGERS IV SOLN
INTRAVENOUS | Status: DC | PRN
  Administered 2016-03-21: 07:00:00 via INTRAVENOUS

## 2016-03-21 MED ORDER — MIDAZOLAM HCL 2 MG/2ML IJ SOLN
INTRAMUSCULAR | Status: AC
  Filled 2016-03-21: qty 2

## 2016-03-21 MED ORDER — FENTANYL CITRATE (PF) 250 MCG/5ML IJ SOLN
INTRAMUSCULAR | Status: AC
  Filled 2016-03-21: qty 5

## 2016-03-21 MED ORDER — CEPHALEXIN 500 MG PO CAPS: 500.0000 mg | ORAL_CAPSULE | Freq: Three times a day (TID) | ORAL | 0 refills | Status: DC

## 2016-03-21 MED ORDER — BACITRACIN-NEOMYCIN-POLYMYXIN OINTMENT TUBE
TOPICAL_OINTMENT | CUTANEOUS | Status: DC | PRN
  Administered 2016-03-21: 1 via TOPICAL

## 2016-03-21 MED ORDER — MIDAZOLAM HCL 5 MG/5ML IJ SOLN
INTRAMUSCULAR | Status: DC | PRN
  Administered 2016-03-21 (×2): 1 mg via INTRAVENOUS

## 2016-03-21 MED ORDER — PROPOFOL 500 MG/50ML IV EMUL
INTRAVENOUS | Status: DC | PRN
  Administered 2016-03-21: 75 ug/kg/min via INTRAVENOUS

## 2016-03-21 SURGICAL SUPPLY — 64 items
ANCHOR CLIK X NEURO (Stimulator) ×2 IMPLANT
BAG DECANTER FOR FLEXI CONT (MISCELLANEOUS) ×2 IMPLANT
BENZOIN TINCTURE PRP APPL 2/3 (GAUZE/BANDAGES/DRESSINGS) IMPLANT
BINDER ABDOMINAL 12 ML 46-62 (SOFTGOODS) ×2 IMPLANT
BLADE CLIPPER SURG (BLADE) IMPLANT
CABLE OR STIMULATOR 2X8 61 (WIRE) ×4 IMPLANT
CHLORAPREP W/TINT 26ML (MISCELLANEOUS) ×2 IMPLANT
CLIP TI WIDE RED SMALL 6 (CLIP) ×2 IMPLANT
DRAPE C-ARM 42X72 X-RAY (DRAPES) ×2 IMPLANT
DRAPE C-ARMOR (DRAPES) ×2 IMPLANT
DRAPE LAPAROTOMY 100X72X124 (DRAPES) ×2 IMPLANT
DRAPE POUCH INSTRU U-SHP 10X18 (DRAPES) ×2 IMPLANT
DRAPE SURG 17X23 STRL (DRAPES) ×2 IMPLANT
DRSG OPSITE POSTOP 3X4 (GAUZE/BANDAGES/DRESSINGS) ×2 IMPLANT
DRSG OPSITE POSTOP 4X6 (GAUZE/BANDAGES/DRESSINGS) ×2 IMPLANT
ELECT REM PT RETURN 9FT ADLT (ELECTROSURGICAL) ×2
ELECTRODE REM PT RTRN 9FT ADLT (ELECTROSURGICAL) ×1 IMPLANT
GAUZE SPONGE 4X4 16PLY XRAY LF (GAUZE/BANDAGES/DRESSINGS) ×2 IMPLANT
GLOVE BIOGEL PI IND STRL 7.5 (GLOVE) ×1 IMPLANT
GLOVE BIOGEL PI INDICATOR 7.5 (GLOVE) ×1
GLOVE ECLIPSE 7.5 STRL STRAW (GLOVE) ×2 IMPLANT
GLOVE EXAM NITRILE LRG STRL (GLOVE) IMPLANT
GLOVE EXAM NITRILE MD LF STRL (GLOVE) IMPLANT
GLOVE EXAM NITRILE XL STR (GLOVE) IMPLANT
GLOVE EXAM NITRILE XS STR PU (GLOVE) IMPLANT
GLOVE INDICATOR 7.0 STRL GRN (GLOVE) ×2 IMPLANT
GLOVE SS N UNI LF 6.5 STRL (GLOVE) ×4 IMPLANT
GOWN STRL REUS W/ TWL LRG LVL3 (GOWN DISPOSABLE) ×2 IMPLANT
GOWN STRL REUS W/ TWL XL LVL3 (GOWN DISPOSABLE) IMPLANT
GOWN STRL REUS W/TWL 2XL LVL3 (GOWN DISPOSABLE) IMPLANT
GOWN STRL REUS W/TWL LRG LVL3 (GOWN DISPOSABLE) ×2
GOWN STRL REUS W/TWL XL LVL3 (GOWN DISPOSABLE)
IPG PRECISION SPECTRA (Stimulator) ×2 IMPLANT
KIT BASIN OR (CUSTOM PROCEDURE TRAY) ×2 IMPLANT
KIT CHARGING (KITS) ×1
KIT CHARGING PRECISION NEURO (KITS) ×1 IMPLANT
KIT PAT PROGRAM FREELINK (KITS) ×1 IMPLANT
KIT ROOM TURNOVER OR (KITS) ×2 IMPLANT
LEAD INFINION CX PERC 70CM (Cable) ×4 IMPLANT
LIQUID BAND (GAUZE/BANDAGES/DRESSINGS) ×4 IMPLANT
NEEDLE 18GX1X1/2 (RX/OR ONLY) (NEEDLE) IMPLANT
NEEDLE ENTRADA 4.5IN (NEEDLE) ×4 IMPLANT
NEEDLE HYPO 25X1 1.5 SAFETY (NEEDLE) ×2 IMPLANT
NS IRRIG 1000ML POUR BTL (IV SOLUTION) ×2 IMPLANT
PACK LAMINECTOMY NEURO (CUSTOM PROCEDURE TRAY) ×2 IMPLANT
PAD ARMBOARD 7.5X6 YLW CONV (MISCELLANEOUS) ×2 IMPLANT
REMOTE CONTROL KIT (KITS) ×2
SPONGE LAP 4X18 X RAY DECT (DISPOSABLE) ×2 IMPLANT
SPONGE SURGIFOAM ABS GEL SZ50 (HEMOSTASIS) IMPLANT
STAPLER SKIN PROX WIDE 3.9 (STAPLE) ×2 IMPLANT
STRIP CLOSURE SKIN 1/2X4 (GAUZE/BANDAGES/DRESSINGS) ×2 IMPLANT
SUT MNCRL AB 4-0 PS2 18 (SUTURE) IMPLANT
SUT SILK 0 (SUTURE) ×1
SUT SILK 0 MO-6 18XCR BRD 8 (SUTURE) ×1 IMPLANT
SUT SILK 0 TIES 10X30 (SUTURE) IMPLANT
SUT SILK 2 0 TIES 10X30 (SUTURE) IMPLANT
SUT VIC AB 2-0 CP2 18 (SUTURE) ×8 IMPLANT
SYR EPIDURAL 5ML GLASS (SYRINGE) ×2 IMPLANT
SYRINGE 10CC LL (SYRINGE) ×2 IMPLANT
TOOL LONG TUNNEL (SPINAL CORD STIMULATOR) ×2 IMPLANT
TOWEL OR 17X24 6PK STRL BLUE (TOWEL DISPOSABLE) ×2 IMPLANT
TOWEL OR 17X26 10 PK STRL BLUE (TOWEL DISPOSABLE) ×2 IMPLANT
WATER STERILE IRR 1000ML POUR (IV SOLUTION) ×2 IMPLANT
YANKAUER SUCT BULB TIP NO VENT (SUCTIONS) ×2 IMPLANT

## 2016-03-21 NOTE — Discharge Instructions (Signed)
Dr. Jaki Steptoe Post-Op Orders ° °• Ice Pack - 20 minutes on (in a pillow case), and 20 minutes off. Wear the ice pack UNDER the binder. °• Follow up in office, they will call you for an appointment in 10 days to 2 weeks. °• Increase activity gradually.   °• No lifting anything heavier than a gallon of milk (10 pounds) until seen in the office. °• Advance diet slowly as tolerated. °• Dressing care:  Keep dressing dry for 3 days, and on Post-op day 4, may shower. °• Call for fever, drainage, and redness. °• No swimming or bathing in a bathtub (do not get into standing water). °•  °

## 2016-03-21 NOTE — Op Note (Signed)
PREOP DX: 1) lumbago  2) lumbar radiculopathy  3) multi-level spondylosis  4) chronic pain  POSTOP DX: same as preop   PROCEDURES PERFORMED:1) intraop fluoro 2) placement of 2 16 contact [employer] Infinion leads 3) placement of Spectra SCS generator  SURGEON:Jadriel Saxer  ASSISTANT: NONE  ANESTHESIA: MAC  EBL: <20cc  DESCRIPTION OF PROCEDURE: After a discussion of risks, benefits and alternatives, informed consent was obtained. The patient was taken to the OR, turned prone onto a Jackson table, all pressure points padded, SCD's placed, and an adequate plane of anesthesia induced. A timeout was taken to verify the correct patient, position, personnel, availability of appropriate equipment, and administration of perioperative antibiotics.  The thoracic and lumbar areas were widely prepped with chloraprep and draped into a sterile field. Fluoroscopy was used to plan a left paramedian incision at the L1-L3  levels, and an incision made with a 10 blade and carried down to the dorsolumbar fascia with the bovie and blunt dissection. Retractors were placed and a 14g [employer] tuohy needle placed into the epidural space at the T12-L1 interspace using biplanar fluoro and loss-of-resistance technique. The needle was aspirated without any return of fluid. A [employer] INFINION lead was introduced and under live AP fluoro advanced until the distal-most 4 contacts overlay the superior aspect T6 vertebral body shadow with the rest of the contacts distributed over the T7 and T8 vertebral bodies in a position just at anatomic midline. A second Infinion lead was placed just right of anatomic midline in the same levels using the same technique. The patient was awakened and the leads tested; impedances were good, and the patient reported good coverage with amplitudes in the 5-7 mA range. 0 silk sutures were placed in the fascia  adjacent to the needles. The needles and stylets were removed under fluoroscopy with no lead migration noted. Leads were then fixed to the fascia with clik-anchors fixation into position with the sutures; repeat images were obtained to verify that there had been no lead migration.  The incision was inspected and hemostasis obtained with the bipolar cautery.  Attention was then turned to creation of a subcutaneous pocket. At the left flank/buttock, a 3 cm incision was made with a 10 blade and using the bovie and blunt dissection a pocket of size appropriate to place a SCS generator. The pocket was trialed, and found to be of adequate size. The pocket was inspected for hemostasis, which was found to be excellent. Using reverse seldinger technique, the leads were tunneled to the pocket site, and the leads inserted into the SCS generator. Impedances were checked, and all found to be excellent. The leads were then all fixed into position with a self-torquing wrench. The wiring was all carefully coiled, placed behind the generator and placed in the pocket.  Both incisions were copiously irrigated with bacitracin-containing irrigation. The lumbar incision was closed in 2 deep layers of interrupted 2-0 vicryl and the skin closed with staples. The pocket incision was closed with a deeper layer of 2-0 vicryl interrupted sutures, and the skin closed with staples. Sterile dressings were applied. Needle, sponge, and instrument counts were correct x2 at the end of the case.  The patient was then carefully awakened from anesthesia, turned supine, an abdominal binder placed, and the patient taken to the recovery room where he underwent complex spinal cord stimulator programming.  COMPLICATIONS: NONE  CONDITION: Stable throughout the course of the procedure and immediately afterward  DISPOSITION: discharge to home, with antibiotics and  pain medicine. Discussed care with the patient and family member. Followup in clinic  will be scheduled in 10-14 days.

## 2016-03-21 NOTE — Anesthesia Procedure Notes (Signed)
Procedure Name: MAC Date/Time: 03/21/2016 7:33 AM Performed by: Garrison Columbus T Pre-anesthesia Checklist: Patient identified, Emergency Drugs available, Suction available and Patient being monitored Patient Re-evaluated:Patient Re-evaluated prior to inductionOxygen Delivery Method: Circle system utilized Preoxygenation: Pre-oxygenation with 100% oxygen Intubation Type: IV induction Placement Confirmation: positive ETCO2 and breath sounds checked- equal and bilateral Dental Injury: Teeth and Oropharynx as per pre-operative assessment

## 2016-03-21 NOTE — Anesthesia Postprocedure Evaluation (Signed)
Anesthesia Post Note  Patient: Suzanne Faulkner  Procedure(s) Performed: Procedure(s) (LRB): LUMBAR SPINAL CORD STIMULATOR INSERTION (N/A)  Patient location during evaluation: PACU Anesthesia Type: MAC Level of consciousness: awake and alert Pain management: pain level controlled Vital Signs Assessment: post-procedure vital signs reviewed and stable Respiratory status: spontaneous breathing, nonlabored ventilation, respiratory function stable and patient connected to nasal cannula oxygen Cardiovascular status: stable and blood pressure returned to baseline Anesthetic complications: no    Last Vitals:  Vitals:   03/21/16 1010 03/21/16 1012  BP: 138/81   Pulse: (!) 53   Resp: 18   Temp:  36.1 C    Last Pain:  Vitals:   03/21/16 0624  TempSrc:   PainSc: 0-No pain                 Tiajuana Amass

## 2016-03-21 NOTE — Transfer of Care (Signed)
Immediate Anesthesia Transfer of Care Note  Patient: Suzanne Faulkner  Procedure(s) Performed: Procedure(s): LUMBAR SPINAL CORD STIMULATOR INSERTION (N/A)  Patient Location: PACU  Anesthesia Type:MAC  Level of Consciousness: awake, alert  and oriented  Airway & Oxygen Therapy: Patient Spontanous Breathing  Post-op Assessment: Report given to RN, Post -op Vital signs reviewed and stable and Patient moving all extremities X 4  Post vital signs: Reviewed and stable  Last Vitals:  Vitals:   03/21/16 0609  BP: (!) 152/86  Pulse: (!) 57  Resp: 20  Temp: 37.2 C    Last Pain:  Vitals:   03/21/16 0624  TempSrc:   PainSc: 0-No pain      Patients Stated Pain Goal: 5 ([id number])  Complications: No apparent anesthesia complications

## 2016-03-24 ENCOUNTER — Encounter (HOSPITAL_COMMUNITY): Payer: Self-pay | Admitting: Anesthesiology

## 2016-04-22 DIAGNOSIS — Z6841 Body Mass Index (BMI) 40.0 and over, adult: Secondary | ICD-10-CM | POA: Diagnosis not present

## 2016-04-22 DIAGNOSIS — I10 Essential (primary) hypertension: Secondary | ICD-10-CM | POA: Diagnosis not present

## 2016-04-22 DIAGNOSIS — M47817 Spondylosis without myelopathy or radiculopathy, lumbosacral region: Secondary | ICD-10-CM | POA: Diagnosis not present

## 2016-04-28 ENCOUNTER — Other Ambulatory Visit: Payer: Self-pay | Admitting: Family Medicine

## 2016-04-28 DIAGNOSIS — I10 Essential (primary) hypertension: Secondary | ICD-10-CM

## 2016-04-30 ENCOUNTER — Ambulatory Visit (INDEPENDENT_AMBULATORY_CARE_PROVIDER_SITE_OTHER): Payer: Medicare Other | Admitting: Family Medicine

## 2016-04-30 ENCOUNTER — Encounter: Payer: Self-pay | Admitting: Family Medicine

## 2016-04-30 VITALS — BP 156/90 | HR 59 | Temp 97.9°F | Resp 16 | Wt 260.0 lb

## 2016-04-30 DIAGNOSIS — Z23 Encounter for immunization: Secondary | ICD-10-CM

## 2016-04-30 DIAGNOSIS — G8929 Other chronic pain: Secondary | ICD-10-CM | POA: Diagnosis not present

## 2016-04-30 DIAGNOSIS — E785 Hyperlipidemia, unspecified: Secondary | ICD-10-CM

## 2016-04-30 DIAGNOSIS — M940 Chondrocostal junction syndrome [Tietze]: Secondary | ICD-10-CM

## 2016-04-30 DIAGNOSIS — E119 Type 2 diabetes mellitus without complications: Secondary | ICD-10-CM | POA: Diagnosis not present

## 2016-04-30 DIAGNOSIS — M549 Dorsalgia, unspecified: Secondary | ICD-10-CM | POA: Diagnosis not present

## 2016-04-30 DIAGNOSIS — I10 Essential (primary) hypertension: Secondary | ICD-10-CM | POA: Diagnosis not present

## 2016-04-30 LAB — POCT GLYCOSYLATED HEMOGLOBIN (HGB A1C)
ESTIMATED AVERAGE GLUCOSE: 123
HEMOGLOBIN A1C: 5.9

## 2016-04-30 MED ORDER — SILVER SULFADIAZINE 1 % EX CREA: 1.0000 "application " | TOPICAL_CREAM | Freq: Every day | CUTANEOUS | 0 refills | Status: DC

## 2016-04-30 MED ORDER — DICLOFENAC SODIUM 75 MG PO TBEC: 75.0000 mg | DELAYED_RELEASE_TABLET | Freq: Every evening | ORAL | 0 refills | Status: DC | PRN

## 2016-04-30 NOTE — Progress Notes (Signed)
Patient: Suzanne Faulkner Female    DOB: Feb 25, 1941   75 y.o.   MRN: [id number] Visit Date: 04/30/2016  Today's Provider: Lelon Huh, MD   Chief Complaint  Patient presents with  . Diabetes    follow up  . Hyperlipidemia    follow up  . Hypertension    follow up   Subjective:    HPI  This is a previous patient of Dr. Venia Minks present today as new patient to me to establish care and follow up on chronic medical problems.    Diabetes Mellitus Type II, Follow-up:   Lab Results  Component Value Date   HGBA1C 6.0 (H) 03/19/2016   HGBA1C 6.0 01/01/2016   HGBA1C 5.6 08/28/2015   Last seen for diabetes 4 months ago.  Management since then includes no changes. She reports fair compliance with treatment. She is not having side effects.  Current symptoms include none and have been stable. Home blood sugar records: not being checked  Episodes of hypoglycemia? no   Current Insulin Regimen: none Most Recent Eye Exam: < 1 year Weight trend: stable Prior visit with dietician: no Current diet: in general, an "unhealthy" diet Current exercise: none  ------------------------------------------------------------------------   Hypertension, follow-up:  BP Readings from Last 3 Encounters:  03/21/16 138/81  03/19/16 123/75  01/01/16 110/60    She was last seen for hypertension 4 months ago.  BP at that visit was 110/60. Management since that visit includes no changes.She reports fair compliance with treatment. Has been out of HCTZ for 1 week.  She is not having side effects.  She is not exercising. She is not adherent to low salt diet.   Outside blood pressures are not being checked. She is experiencing none.  Patient denies chest pain, chest pressure/discomfort, claudication, dyspnea, exertional chest pressure/discomfort, fatigue, irregular heart beat, lower extremity edema, near-syncope, orthopnea, palpitations, paroxysmal nocturnal dyspnea, syncope and tachypnea.     Cardiovascular risk factors include advanced age (older than 45 for men, 29 for women), diabetes mellitus, dyslipidemia and hypertension.  Use of agents associated with hypertension: NSAIDS.   ------------------------------------------------------------------------    Lipid/Cholesterol, Follow-up:   Last seen for this 4 months ago.  Management since that visit includes no changes.  Last Lipid Panel:    Component Value Date/Time   CHOL 167 08/28/2015 1247   TRIG 129 08/28/2015 1247   HDL 59 08/28/2015 1247   CHOLHDL 2.8 08/28/2015 1247   CHOLHDL 2.9 01/26/2013 0543   VLDL 25 01/26/2013 0543   LDLCALC 82 08/28/2015 1247    She reports good compliance with treatment. She is not having side effects.   Wt Readings from Last 3 Encounters:  04/30/16 260 lb (117.9 kg)  03/21/16 258 lb (117 kg)  03/19/16 258 lb (117 kg)    ------------------------------------------------------------------------ Also reports persistent right side of her chest for last couple of weeks. Is not at all exertional and no associated with dyspnea or dizziness.     Allergies  Allergen Reactions  . No Known Allergies Other (See Comments)     Current Outpatient Prescriptions:  .  Ascorbic Acid (VITAMIN C PO), Take 1 tablet by mouth every morning., Disp: , Rfl:  .  aspirin 325 MG tablet, Take 325 mg by mouth daily., Disp: , Rfl:  .  b complex vitamins tablet, Take 1 tablet by mouth every morning. , Disp: , Rfl:  .  benazepril (LOTENSIN) 10 MG tablet, TAKE ONE TABLET BY MOUTH ONCE DAILY (Patient taking  differently: TAKE 10 MG BY MOUTH ONCE DAILY), Disp: 90 tablet, Rfl: 1 .  Calcium Carbonate-Vitamin D (CALCIUM + D PO), Take 1 tablet by mouth every morning. , Disp: , Rfl:  .  HYDROcodone-acetaminophen (NORCO) 10-325 MG tablet, Take 1 tablet by mouth every 4 (four) hours as needed for moderate pain., Disp: 50 tablet, Rfl: 0 .  lovastatin (MEVACOR) 20 MG tablet, TAKE ONE TABLET BY MOUTH AT BEDTIME (Patient  taking differently: TAKE 20 MG BY MOUTH AT BEDTIME), Disp: 90 tablet, Rfl: 3 .  metFORMIN (GLUCOPHAGE) 500 MG tablet, TAKE ONE TABLET BY MOUTH TWICE DAILY (Patient taking differently: TAKE 500 MG BY MOUTH TWICE DAILY), Disp: 180 tablet, Rfl: 1 .  naproxen sodium (ANAPROX) 220 MG tablet, Take 220 mg by mouth as needed (for pain). Reported on 01/01/2016, Disp: , Rfl:  .  hydrochlorothiazide (MICROZIDE) 12.5 MG capsule, TAKE ONE CAPSULE BY MOUTH ONCE DAILY (Patient not taking: Reported on 04/30/2016), Disp: 180 capsule, Rfl: 1  Review of Systems  Constitutional: Negative for appetite change, chills, fatigue and fever.  Respiratory: Negative for chest tightness and shortness of breath.   Cardiovascular: Negative for chest pain and palpitations.  Gastrointestinal: Negative for abdominal pain, nausea and vomiting.  Endocrine: Negative for cold intolerance, heat intolerance, polydipsia, polyphagia and polyuria.  Musculoskeletal: Positive for myalgias.       Swelling and pain near the right clavicle  Neurological: Negative for dizziness and weakness.       Burning in both feet    Social History  Substance Use Topics  . Smoking status: Former Smoker    Packs/day: 0.50    Types: Cigarettes    Quit date: 01/08/2014  . Smokeless tobacco: Never Used  . Alcohol use No   Objective:   BP (!) 156/90 (BP Location: Left Arm, Patient Position: Sitting, Cuff Size: Large)   Pulse (!) 59   Temp 97.9 F (36.6 C) (Oral)   Resp 16   Wt 260 lb (117.9 kg)   SpO2 98% Comment: room air  BMI 41.97 kg/m   Physical Exam   General Appearance:    Alert, cooperative, no distress  Eyes:    PERRL, conjunctiva/corneas clear, EOM's intact       Lungs:     Clear to auscultation bilaterally, respirations unlabored  Heart:    Regular rate and rhythm  Neurologic:   Awake, alert, oriented x 3. No apparent focal neurological           defect.   MS:   Point tenderness right upper chest. No swelling. Pain with active  extension and rotation of right shoulder.    Results for orders placed or performed in visit on 04/30/16  POCT HgB A1C  Result Value Ref Range   Hemoglobin A1C 5.9    Est. average glucose Bld gHb Est-mCnc 123        Assessment & Plan:     1. Type 2 diabetes mellitus without complication, without long-term current use of insulin (HCC) Well controlled.  Continue current medications.   - POCT HgB A1C  2. Need for influenza vaccination  - Flu vaccine HIGH DOSE PF (Fluzone High dose)  3. Costochondritis  - diclofenac (VOLTAREN) 75 MG EC tablet; Take 1 tablet (75 mg total) by mouth at bedtime as needed for moderate pain. Take with food  Dispense: 30 tablet; Refill: 0  4. Hyperlipidemia She is tolerating lovastatin well with no adverse effects.   - Lipid panel - Hepatic function panel  5.  Chronic back pain Doing well with recent spinal cords place by patient report.   6. Essential hypertension Well controlled.  Continue current medications.    The entirety of the information documented in the History of Present Illness, Review of Systems and Physical Exam were personally obtained by me. Portions of this information were initially documented by Meyer Cory, CMA and reviewed by me for thoroughness and accuracy.        Lelon Huh, MD  [facility]

## 2016-05-01 ENCOUNTER — Ambulatory Visit: Payer: Medicare Other | Admitting: Family Medicine

## 2016-05-02 ENCOUNTER — Encounter: Payer: Self-pay | Admitting: Family Medicine

## 2016-05-02 DIAGNOSIS — M549 Dorsalgia, unspecified: Secondary | ICD-10-CM

## 2016-05-02 DIAGNOSIS — G8929 Other chronic pain: Secondary | ICD-10-CM | POA: Insufficient documentation

## 2016-05-05 DIAGNOSIS — E785 Hyperlipidemia, unspecified: Secondary | ICD-10-CM | POA: Diagnosis not present

## 2016-05-06 LAB — LIPID PANEL
Chol/HDL Ratio: 2.2 ratio units (ref 0.0–4.4)
Cholesterol, Total: 132 mg/dL (ref 100–199)
HDL: 61 mg/dL (ref >39)
LDL Calculated: 51 mg/dL (ref 0–99)
Triglycerides: 101 mg/dL (ref 0–149)
VLDL CHOLESTEROL CAL: 20 mg/dL (ref 5–40)

## 2016-05-06 LAB — HEPATIC FUNCTION PANEL
ALBUMIN: 3.8 g/dL (ref 3.5–4.8)
ALK PHOS: 80 IU/L (ref 39–117)
ALT: 9 IU/L (ref 0–32)
AST: 17 IU/L (ref 0–40)
BILIRUBIN TOTAL: 0.4 mg/dL (ref 0.0–1.2)
Bilirubin, Direct: 0.15 mg/dL (ref 0.00–0.40)
TOTAL PROTEIN: 6 g/dL (ref 6.0–8.5)

## 2016-05-07 ENCOUNTER — Other Ambulatory Visit: Payer: Self-pay | Admitting: Family Medicine

## 2016-05-07 ENCOUNTER — Telehealth: Payer: Self-pay

## 2016-05-07 DIAGNOSIS — E119 Type 2 diabetes mellitus without complications: Secondary | ICD-10-CM

## 2016-05-07 DIAGNOSIS — E785 Hyperlipidemia, unspecified: Secondary | ICD-10-CM

## 2016-05-07 NOTE — Telephone Encounter (Signed)
Dr. Fisher's pt.   Thanks,   -Alva Broxson  

## 2016-05-07 NOTE — Telephone Encounter (Signed)
LMTCB 05/07/2016  Thanks,   -Laura  

## 2016-05-07 NOTE — Telephone Encounter (Signed)
Pt advised.   Thanks,   -Diva Lemberger  

## 2016-05-07 NOTE — Telephone Encounter (Signed)
-----   Message from Birdie Sons, MD sent at 05/06/2016  9:07 PM EDT ----- Cholesterol is very good at 132 and liver functions are normal. Continue current medications.  Follow up 6 months.

## 2016-05-23 ENCOUNTER — Other Ambulatory Visit: Payer: Self-pay | Admitting: Family Medicine

## 2016-05-23 DIAGNOSIS — Z1231 Encounter for screening mammogram for malignant neoplasm of breast: Secondary | ICD-10-CM

## 2016-05-26 ENCOUNTER — Emergency Department (HOSPITAL_COMMUNITY)
Admission: EM | Admit: 2016-05-26 | Discharge: 2016-05-26 | Disposition: A | Discharge status: Home / Self Care | Payer: Medicare Other | Attending: Emergency Medicine | Admitting: Emergency Medicine

## 2016-05-26 ENCOUNTER — Emergency Department (HOSPITAL_COMMUNITY): Payer: Medicare Other

## 2016-05-26 ENCOUNTER — Encounter (HOSPITAL_COMMUNITY): Payer: Self-pay | Admitting: Emergency Medicine

## 2016-05-26 DIAGNOSIS — Z87891 Personal history of nicotine dependence: Secondary | ICD-10-CM | POA: Insufficient documentation

## 2016-05-26 DIAGNOSIS — E119 Type 2 diabetes mellitus without complications: Secondary | ICD-10-CM | POA: Diagnosis not present

## 2016-05-26 DIAGNOSIS — R222 Localized swelling, mass and lump, trunk: Secondary | ICD-10-CM | POA: Diagnosis not present

## 2016-05-26 DIAGNOSIS — Z7982 Long term (current) use of aspirin: Secondary | ICD-10-CM | POA: Insufficient documentation

## 2016-05-26 DIAGNOSIS — Z96652 Presence of left artificial knee joint: Secondary | ICD-10-CM | POA: Diagnosis not present

## 2016-05-26 DIAGNOSIS — I10 Essential (primary) hypertension: Secondary | ICD-10-CM | POA: Insufficient documentation

## 2016-05-26 DIAGNOSIS — Z7984 Long term (current) use of oral hypoglycemic drugs: Secondary | ICD-10-CM | POA: Insufficient documentation

## 2016-05-26 DIAGNOSIS — M7989 Other specified soft tissue disorders: Secondary | ICD-10-CM | POA: Diagnosis not present

## 2016-05-26 LAB — COMPREHENSIVE METABOLIC PANEL
ALT: 11 U/L — AB (ref 14–54)
ANION GAP: 9 (ref 5–15)
AST: 20 U/L (ref 15–41)
Albumin: 3.9 g/dL (ref 3.5–5.0)
Alkaline Phosphatase: 81 U/L (ref 38–126)
BUN: 12 mg/dL (ref 6–20)
CHLORIDE: 105 mmol/L (ref 101–111)
CO2: 29 mmol/L (ref 22–32)
CREATININE: 1.43 mg/dL — AB (ref 0.44–1.00)
Calcium: 10 mg/dL (ref 8.9–10.3)
GFR, EST AFRICAN AMERICAN: 40 mL/min — AB (ref >60)
GFR, EST NON AFRICAN AMERICAN: 35 mL/min — AB (ref >60)
Glucose, Bld: 91 mg/dL (ref 65–99)
Potassium: 3.9 mmol/L (ref 3.5–5.1)
Sodium: 143 mmol/L (ref 135–145)
Total Bilirubin: 0.6 mg/dL (ref 0.3–1.2)
Total Protein: 7.1 g/dL (ref 6.5–8.1)

## 2016-05-26 LAB — CBC WITH DIFFERENTIAL/PLATELET
BASOS PCT: 1 %
Basophils Absolute: 0.1 10*3/uL (ref 0.0–0.1)
EOS ABS: 0.2 10*3/uL (ref 0.0–0.7)
Eosinophils Relative: 2 %
HCT: 43.5 % (ref 36.0–46.0)
HEMOGLOBIN: 14 g/dL (ref 12.0–15.0)
Lymphocytes Relative: 46 %
Lymphs Abs: 3.2 10*3/uL (ref 0.7–4.0)
MCH: 31 pg (ref 26.0–34.0)
MCHC: 32.2 g/dL (ref 30.0–36.0)
MCV: 96.2 fL (ref 78.0–100.0)
Monocytes Absolute: 0.5 10*3/uL (ref 0.1–1.0)
Monocytes Relative: 7 %
NEUTROS PCT: 44 %
Neutro Abs: 3.1 10*3/uL (ref 1.7–7.7)
Platelets: 252 10*3/uL (ref 150–400)
RBC: 4.52 MIL/uL (ref 3.87–5.11)
RDW: 13.1 % (ref 11.5–15.5)
WBC: 7 10*3/uL (ref 4.0–10.5)

## 2016-05-26 NOTE — Discharge Instructions (Signed)
Units return if any concerns develop or if feeling worse, if there is swelling redness or increasing pain at swelling site. If there are fevers chills or chest pain or shortness of breath he was immediately return.

## 2016-05-26 NOTE — ED Triage Notes (Signed)
Pt sts swollen area to right collarbone x 3 months

## 2016-05-26 NOTE — ED Provider Notes (Signed)
[facility] DEPT Provider Note  CSN: [id number] Arrival Date & Time: 05/26/16 @ 82  History    Chief Complaint Chief Complaint  Patient presents with  . Mass    HPI Suzanne Faulkner is a 75 y.o. female.  Patient presents to the ED via private vehicle for assessment of of swelling per the proximal right collar bone since August. No trauma or previous fractures. Not painful now however does throb intermittently.  Context of spinal cord stimulator placed on 03/27/16 for low back "issues" per the patient. Swelling was present before placement of the stimulator. Previous smoker.  Associated symptoms of fevers chills chest pain sob, weakness per upper extremities or abnormal sensation of upper extremities absent. No previous diagnosis of cancer of hematologic conditions.  Past Medical & Surgical History    Past Medical History:  Diagnosis Date  . Colon polyps 2009  . Nocturia    Patient Active Problem List   Diagnosis Date Noted  . Chronic back pain 05/02/2016  . Hyperlipidemia 04/04/2015  . Personal history of colonic polyps 02/02/2013  . TIA (transient ischemic attack) 01/25/2013  . HTN (hypertension) 01/25/2013  . Tobacco abuse, in remission 01/25/2013  . DM type 2 (diabetes mellitus, type 2) ([place]) 01/25/2013  . Bradycardia 01/25/2013   Past Surgical History:  Procedure Laterality Date  . ABDOMINAL HYSTERECTOMY    . CHOLECYSTECTOMY    . goiter removal    . JOINT REPLACEMENT Left    knee  . SPINAL CORD STIMULATOR INSERTION N/A 03/21/2016   Procedure: LUMBAR SPINAL CORD STIMULATOR INSERTION;  Surgeon: Clydell Hakim, MD;  Location: [facility];  Service: Neurosurgery;  Laterality: N/A;  . TONSILLECTOMY      Family & Social History    Family History  Problem Relation Age of Onset  . Heart disease Mother   . Diabetes Mother   . Cancer Father   . Cancer Sister     breast   Social History  Substance Use Topics  . Smoking status: Former Smoker    Packs/day: 0.50    Types: Cigarettes    Quit date: 01/08/2014  . Smokeless tobacco: Never Used  . Alcohol use No    Home Medications    Prior to Admission medications   Medication Sig Start Date End Date Taking? Authorizing Provider  Ascorbic Acid (VITAMIN C PO) Take 1 tablet by mouth every morning.    Historical Provider, MD  aspirin 325 MG tablet Take 325 mg by mouth daily. 01/26/13   Ripudeep Krystal Eaton, MD  b complex vitamins tablet Take 1 tablet by mouth every morning.     Historical Provider, MD  benazepril (LOTENSIN) 10 MG tablet TAKE ONE TABLET BY MOUTH ONCE DAILY Patient taking differently: TAKE 10 MG BY MOUTH ONCE DAILY 03/04/16   Birdie Sons, MD  Calcium Carbonate-Vitamin D (CALCIUM + D PO) Take 1 tablet by mouth every morning.     Historical Provider, MD  diclofenac (VOLTAREN) 75 MG EC tablet Take 1 tablet (75 mg total) by mouth at bedtime as needed for moderate pain. Take with food 04/30/16   Birdie Sons, MD  hydrochlorothiazide (MICROZIDE) 12.5 MG capsule TAKE ONE CAPSULE BY MOUTH ONCE DAILY Patient not taking: Reported on 04/30/2016 04/28/16   Birdie Sons, MD  HYDROcodone-acetaminophen [facility]) 10-325 MG tablet Take 1 tablet by mouth every 4 (four) hours as needed for moderate pain. 03/21/16   Clydell Hakim, MD  lovastatin (MEVACOR) 20 MG tablet TAKE ONE TABLET BY MOUTH AT BEDTIME  05/07/16   Mar Daring, PA-C  metFORMIN (GLUCOPHAGE) 500 MG tablet TAKE ONE TABLET BY MOUTH TWICE DAILY 05/07/16   Mar Daring, PA-C  naproxen sodium (ANAPROX) 220 MG tablet Take 220 mg by mouth as needed (for pain). Reported on 01/01/2016    Historical Provider, MD  silver sulfADIAZINE (SILVADENE) 1 % cream Apply 1 application topically daily. 04/30/16   Birdie Sons, MD    Allergies    No known allergies  I reviewed & agree with nursing's documentation on the patient's past medical, surgical, social & family histories as well as their allergies.  Review of Systems  Complete ROS obtained, and is  negative except as stated in HPI.  Physical Exam  Updated Vital Signs BP 172/66 (BP Location: Left Arm)   Pulse (!) 49   Temp 97.9 F (36.6 C) (Oral)   Resp 16   SpO2 96%  I have reviewed the triage vital signs and the nursing notes. Physical Exam CONST: Patient alert, well appearing, in no apparent distress.  EYES: PERRLA. EOMI. Conjunctiva w/o d/c. Lids AT w/o swelling.  ENMT: External Nares & Ears AT w/o swelling. Oropharynx patent. MM moist.  NECK: ROM full w/o rigidity. Trachea midline. JVD absent. No carotid bruits bilaterally.  CVS: +S1/S2 w/o obvious murmur. Lower extremities w/o pitting edema.  RESP: Respiratory effort unlabored w/o retractions & accessory muscle use. BS clear bilaterally. No Stridor. Chest inspection Reveals a 3 cm x 3 cm swelling over the proximal acromioclavicular joint. There is no associated tenderness to palpation erythema or induration. Site is not warm to touch.   GI: Soft & ND. +BS x 4. TTP absent. Hernia absent. Guarding & Rebound absent.  BACK: CVA TTP absent bilaterally.  SKIN: Skin warm & dry. Turgor good. No rash.  PSYCH: Alert. Oriented. Affect and mood appropriate.  NEURO: CN II-XII grossly intact. Motor exam symmetric w/ upper & lower extremities 5/5 bilaterally. Sensation grossly intact.  MSK: Joints located & stable, w/o obvious dislocation & obvious deformity or crepitus absent w/ Cap refill < 2 sec. Peripheral pulses 2+ & equal in all extremities.   ED Treatments & Results   Labs (only abnormal results are displayed) Labs Reviewed  CBC WITH DIFFERENTIAL/PLATELET  COMPREHENSIVE METABOLIC PANEL    EKG    EKG Interpretation  Date/Time:    Ventricular Rate:    PR Interval:    QRS Duration:   QT Interval:    QTC Calculation:   R Axis:     Text Interpretation:         Radiology Dg Chest 2 View  Result Date: 05/26/2016 CLINICAL DATA:  Right collar bone swelling, no known injury EXAM: CHEST  2 VIEW COMPARISON:  None.  FINDINGS: Cardiomediastinal silhouette is unremarkable. No infiltrate or pleural effusion. No pulmonary edema. Osteopenia and degenerative changes mid thoracic spine. Spinal stimulator wires are noted mid thoracic spine. IMPRESSION: No active cardiopulmonary disease. Electronically Signed   By: Lahoma Crocker M.D.   On: 05/26/2016 11:56    Pertinent labs & imaging results that were available during my care of the patient were independently visualized by me and considered in my medical decision making, please see chart for details. Formal interpretation provided by Radiology.  Procedures (including critical care time) Procedures  Procedure: Limited Soft Tissue Ultrasound evaluation of the right clavicle and upper thorax..  Indications: To evaluate for infection, vascular abnormality & foreign body.  Multiple views of the soft tissue up the midclavicular to acromioclavicular soft tissues  are obtained with a Linear probe for the purposes of evaluation of skin and underlying soft tissues.  The study is performed and interpreted by me. Images are also reviewed by Dr. Laverta Baltimore, my attending. Images are archived per our departmental policy/electronically.  Findings: Heterogeneous fluid collection absent. Hyperemia/edema of surrounding tissue absent. Hypoechoic structure present. No color flow or Doppler signal.  Interpretation: Abscess absent. Cellulitis absent. Foreign Body absent.  Comments: Does not appear to be vascular and due to hypoechoic nature and similar contrast to surrounding soft tissue believe soft tissue mass most likely.  CPT Codes:  Neck [id number] Upper extremity [id number] Axilla V291356 Chest wall [id number] Beast [id number] Upper back [id number] Lower back [id number] Abdominal wall [id number] Pelvic wall [id number] Lower extremity V291356 Other soft tissue [id number]  Medications Ordered in ED Medications - No data to display  Initial Impression & Plan / ED Course & Results / Final  Disposition   Initial Impression & Plan Due to the chronicity of patient's right clavicular swelling consideration at this time includes soft tissue versus bony mass. Consideration includes osteomyelitis however I obtained chest x-ray AP and lateral and upon review I appreciate no evidence of bony lesion to the right clavicle and there is no evidence of cortical integrity loss. In light of the patient's clinical picture and physical examination not consistent with septic arthritis or abscess.  Patient denies chest pain or shortness of breath and per review of x-ray I do not appreciate evidence of Pancoast tumor. Per physical exam patient has no neurovascular deficits in upper extremities bilaterally. No carotid bruits obtained soft tissue ultrasound with Doppler and color flow and appreciate that structures not vascular in due to Steinway visualization not consistent with cartilage or other calcified bone structure. I obtained screening laboratory work which upon review I appreciate no concerning findings or acute abnormalities on CMP or CBC that require further evaluation at this time as patient is not symptomatic. Patient's labs not concerning for underlying hematologic or bone marrow process and therefore make cancerous process less likely at this time however my concern was explained in great detail to the patient states understanding. Patient denies shortness of breath or chest pain. And per physical examination do not have concern for infectious etiology or underlying vascular/aortic dissection or aneurysm.  Final Disposition Reassessment of the patient reveals patient is in no acute distress and resting comfortably in bed. Extensive discussion of the patient's bedside regarding close follow-up with orthopedic service to further evaluate soft tissue mass.  ED Course in its entirety, care plan & clinical impressions w/ associated risks were reviewed w/ the patient. In light of the patient's reassuring  evaluation above, I consider discharge disposition reasonable. They are in agreement. I gave my typical, strict return precautions in simple, non-technical language. We also discussed symptoms that are most concerning & would necessitate emergent return. I explicitly told them to immediately return to the ED if new symptoms develop, if worse, or for ANY concern. Treatments & follow up plan agreed upon & I confirmed all concerns & questions were addressed prior to discharge.  Follow Up Birdie Sons, MD [place] [address] [phone]  Schedule an appointment as soon as possible for a visit in 2 days For symptomatic reassessment AND TO ASSIST WITH REFERRAL TO ORTHOPEDIC BONE/SOFT TISSUE MASS/CANCER [place] [phone] Schedule an appointment as soon as possible for a visit in 1 week  FOR FOLLOW UP WITH ORTHOPEDIC BONE/SOFT TISSUE MASS/CANCER SPECIALIST  [facility] AND [facility] [address] [place] [phone] Schedule an appointment as soon as possible for a visit in 1 week [place] BONE/SOFT TISSUE MASS/CANCER [place] [address] [id number] [place] [phone] Go to  [place], if the concerning symptoms we discussed develop   New Prescriptions New Prescriptions   No medications on file    Final Clinical Impression & ED Diagnoses   1. Chest wall mass    Patient care discussed with the attending physician, Dr. Laverta Baltimore, who oversaw their evaluation & treatment & voiced agreement.  Note: This document was prepared using Dragon voice recognition software and may include unintentional dictation errors.  House Officer: Voncille Lo, MD, Emergency Medicine Resident.    Voncille Lo, MD 05/26/16 [place], MD 05/27/16 [phone]

## 2016-06-02 ENCOUNTER — Ambulatory Visit (INDEPENDENT_AMBULATORY_CARE_PROVIDER_SITE_OTHER): Payer: Medicare Other | Admitting: Orthopaedic Surgery

## 2016-06-02 ENCOUNTER — Encounter (INDEPENDENT_AMBULATORY_CARE_PROVIDER_SITE_OTHER): Payer: Self-pay | Admitting: Orthopaedic Surgery

## 2016-06-02 VITALS — Ht 66.0 in | Wt 261.0 lb

## 2016-06-02 DIAGNOSIS — M25511 Pain in right shoulder: Secondary | ICD-10-CM

## 2016-06-02 NOTE — Progress Notes (Signed)
Office Visit Note   Patient: Suzanne Faulkner           Date of Birth: Dec 14, 1940           MRN: [id number] Visit Date: 06/02/2016              Requested by: Birdie Sons, MD [address] [place], [place] [address] PCP: Lelon Huh, MD   Assessment & Plan: Visit Diagnoses:  1. Sternoclavicular joint pain, right     Plan: CT chest scan to evaluate right [place] joint, possible lipoma, dislocation, unknown injury  Follow-Up Instructions: Return in about 2 weeks (around 06/16/2016) for REVIEW CT scan.   Orders:  No orders of the defined types were placed in this encounter.  No orders of the defined types were placed in this encounter.     Procedures: No procedures performed   Clinical Data: No additional findings.   Subjective: Chief Complaint  Patient presents with  . Follow-up    right collar bone pain     Right [place] joint swelling and occasional pain, radiation to right shoulder, denies constitutional sxs.  Denies any difficulty swallowing or breathing or trauma.  Pain is worse sporadically without exacerbating or alleviating features.  Pain has been going on for 2 weeks.    Review of Systems  Constitutional: Negative.   HENT: Negative.   Respiratory: Negative.   Cardiovascular: Negative.      Objective: Vital Signs: Ht 5\' 6"  (1.676 m)   Wt 261 lb (118.4 kg)   BMI 42.13 kg/m   Physical Exam  Right Shoulder Exam  Right shoulder exam is normal.  Tenderness  Right shoulder tenderness location: right [place] joint tenderness.  Range of Motion  The patient has normal right shoulder ROM.  Muscle Strength  The patient has normal right shoulder strength.  Tests  Apprehension: negative Cross Arm: negative Drop Arm: negative Hawkin's test: negative Impingement: negative Sulcus: absent  Other  Scars: absent Sensation: normal Pulse: present  Comments:  Mod swelling over right [place] joint      Specialty Comments:  No specialty comments  available.  Imaging: Dg Chest 2 View  Result Date: 05/26/2016 CLINICAL DATA:  Right collar bone swelling, no known injury EXAM: CHEST  2 VIEW COMPARISON:  None. FINDINGS: Cardiomediastinal silhouette is unremarkable. No infiltrate or pleural effusion. No pulmonary edema. Osteopenia and degenerative changes mid thoracic spine. Spinal stimulator wires are noted mid thoracic spine. IMPRESSION: No active cardiopulmonary disease. Electronically Signed   By: Lahoma Crocker M.D.   On: 05/26/2016 11:56     PMFS History: Patient Active Problem List   Diagnosis Date Noted  . Chronic back pain 05/02/2016  . Hyperlipidemia 04/04/2015  . Personal history of colonic polyps 02/02/2013  . TIA (transient ischemic attack) 01/25/2013  . HTN (hypertension) 01/25/2013  . Tobacco abuse, in remission 01/25/2013  . DM type 2 (diabetes mellitus, type 2) ([place]) 01/25/2013  . Bradycardia 01/25/2013   Past Medical History:  Diagnosis Date  . Colon polyps 2009  . Nocturia     Family History  Problem Relation Age of Onset  . Heart disease Mother   . Diabetes Mother   . Cancer Father   . Cancer Sister     breast    Past Surgical History:  Procedure Laterality Date  . ABDOMINAL HYSTERECTOMY    . CHOLECYSTECTOMY    . goiter removal    . JOINT REPLACEMENT Left    knee  . SPINAL CORD STIMULATOR  INSERTION N/A 03/21/2016   Procedure: LUMBAR SPINAL CORD STIMULATOR INSERTION;  Surgeon: Clydell Hakim, MD;  Location: [facility];  Service: Neurosurgery;  Laterality: N/A;  . TONSILLECTOMY     Social History   Occupational History  . Not on file.   Social History Main Topics  . Smoking status: Former Smoker    Packs/day: 0.50    Types: Cigarettes    Quit date: 01/08/2014  . Smokeless tobacco: Never Used  . Alcohol use No  . Drug use: No  . Sexual activity: Not on file

## 2016-06-02 NOTE — Progress Notes (Deleted)
   Office Visit Note   Patient: Suzanne Faulkner           Date of Birth: September 18, 1940           MRN: [id number] Visit Date: 06/02/2016              Requested by: Birdie Sons, MD [address] [place], [place] [address] PCP: Lelon Huh, MD   Assessment & Plan: Visit Diagnoses: No diagnosis found.  Plan: ***  Follow-Up Instructions: No Follow-up on file.   Orders:  No orders of the defined types were placed in this encounter.  No orders of the defined types were placed in this encounter.     Procedures: No procedures performed   Clinical Data: No additional findings.   Subjective: Chief Complaint  Patient presents with  . Follow-up    collar bone     Right collar bone pain. Went to urgent care [facility]. Xrays were done. Pain level 4/10. Pain comes and goes. +swelling.     Review of Systems   Objective: Vital Signs: There were no vitals taken for this visit.  Physical Exam  Ortho Exam  Specialty Comments:  No specialty comments available.  Imaging: Dg Chest 2 View  Result Date: 05/26/2016 CLINICAL DATA:  Right collar bone swelling, no known injury EXAM: CHEST  2 VIEW COMPARISON:  None. FINDINGS: Cardiomediastinal silhouette is unremarkable. No infiltrate or pleural effusion. No pulmonary edema. Osteopenia and degenerative changes mid thoracic spine. Spinal stimulator wires are noted mid thoracic spine. IMPRESSION: No active cardiopulmonary disease. Electronically Signed   By: Lahoma Crocker M.D.   On: 05/26/2016 11:56     PMFS History: Patient Active Problem List   Diagnosis Date Noted  . Chronic back pain 05/02/2016  . Hyperlipidemia 04/04/2015  . Personal history of colonic polyps 02/02/2013  . TIA (transient ischemic attack) 01/25/2013  . HTN (hypertension) 01/25/2013  . Tobacco abuse, in remission 01/25/2013  . DM type 2 (diabetes mellitus, type 2) ([place]) 01/25/2013  . Bradycardia 01/25/2013   Past Medical History:  Diagnosis Date  .  Colon polyps 2009  . Nocturia     Family History  Problem Relation Age of Onset  . Heart disease Mother   . Diabetes Mother   . Cancer Father   . Cancer Sister     breast    Past Surgical History:  Procedure Laterality Date  . ABDOMINAL HYSTERECTOMY    . CHOLECYSTECTOMY    . goiter removal    . JOINT REPLACEMENT Left    knee  . SPINAL CORD STIMULATOR INSERTION N/A 03/21/2016   Procedure: LUMBAR SPINAL CORD STIMULATOR INSERTION;  Surgeon: Clydell Hakim, MD;  Location: [facility];  Service: Neurosurgery;  Laterality: N/A;  . TONSILLECTOMY     Social History   Occupational History  . Not on file.   Social History Main Topics  . Smoking status: Former Smoker    Packs/day: 0.50    Types: Cigarettes    Quit date: 01/08/2014  . Smokeless tobacco: Never Used  . Alcohol use No  . Drug use: No  . Sexual activity: Not on file

## 2016-06-03 DIAGNOSIS — Z6841 Body Mass Index (BMI) 40.0 and over, adult: Secondary | ICD-10-CM | POA: Diagnosis not present

## 2016-06-03 DIAGNOSIS — G894 Chronic pain syndrome: Secondary | ICD-10-CM | POA: Diagnosis not present

## 2016-06-03 DIAGNOSIS — I10 Essential (primary) hypertension: Secondary | ICD-10-CM | POA: Diagnosis not present

## 2016-06-03 DIAGNOSIS — M545 Low back pain: Secondary | ICD-10-CM | POA: Diagnosis not present

## 2016-06-03 DIAGNOSIS — M47817 Spondylosis without myelopathy or radiculopathy, lumbosacral region: Secondary | ICD-10-CM | POA: Diagnosis not present

## 2016-06-03 DIAGNOSIS — E1142 Type 2 diabetes mellitus with diabetic polyneuropathy: Secondary | ICD-10-CM | POA: Diagnosis not present

## 2016-06-03 DIAGNOSIS — Z9689 Presence of other specified functional implants: Secondary | ICD-10-CM | POA: Diagnosis not present

## 2016-06-06 NOTE — Progress Notes (Signed)
This encounter was created in error - please disregard.

## 2016-06-06 NOTE — Addendum Note (Signed)
Addended by: Daylene Posey T on: 06/06/2016 03:18 PM   Modules accepted: Miquel Dunn

## 2016-06-09 ENCOUNTER — Ambulatory Visit
Admission: RE | Admit: 2016-06-09 | Discharge: 2016-06-09 | Disposition: A | Discharge status: Home / Self Care | Payer: Medicare Other | Source: Ambulatory Visit | Attending: Orthopaedic Surgery | Admitting: Orthopaedic Surgery

## 2016-06-09 DIAGNOSIS — M25511 Pain in right shoulder: Secondary | ICD-10-CM

## 2016-06-09 DIAGNOSIS — R918 Other nonspecific abnormal finding of lung field: Secondary | ICD-10-CM | POA: Diagnosis not present

## 2016-06-24 ENCOUNTER — Ambulatory Visit (INDEPENDENT_AMBULATORY_CARE_PROVIDER_SITE_OTHER): Payer: Medicare Other | Admitting: Family Medicine

## 2016-06-24 VITALS — BP 139/91 | HR 60 | Temp 98.9°F | Ht 66.0 in | Wt 256.2 lb

## 2016-06-24 DIAGNOSIS — Z0000 Encounter for general adult medical examination without abnormal findings: Secondary | ICD-10-CM

## 2016-06-24 NOTE — Progress Notes (Signed)
Subjective:   Suzanne Faulkner is a 75 y.o. female who presents for Medicare Annual (Subsequent) preventive examination.  Review of Systems:  N/A  Cardiac Risk Factors include: advanced age (>29men, >62 women);diabetes mellitus;dyslipidemia;hypertension;obesity (BMI >30kg/m2)     Objective:     Vitals: BP (!) 139/91 (BP Location: Right Arm)   Pulse 60   Temp 98.9 F (37.2 C) (Oral)   Ht 5\' 6"  (1.676 m)   Wt 256 lb 4 oz (116.2 kg)   BMI 41.36 kg/m   Body mass index is 41.36 kg/m.   Tobacco History  Smoking Status  . Former Smoker  . Packs/day: 0.50  . Types: Cigarettes  . Quit date: 01/08/2014  Smokeless Tobacco  . Never Used     Counseling given: Not Answered   Past Medical History:  Diagnosis Date  . Colon polyps 2009  . Nocturia    Past Surgical History:  Procedure Laterality Date  . ABDOMINAL HYSTERECTOMY    . CHOLECYSTECTOMY    . goiter removal    . JOINT REPLACEMENT Left    knee  . SPINAL CORD STIMULATOR INSERTION N/A 03/21/2016   Procedure: LUMBAR SPINAL CORD STIMULATOR INSERTION;  Surgeon: Clydell Hakim, MD;  Location: [facility];  Service: Neurosurgery;  Laterality: N/A;  . TONSILLECTOMY     Family History  Problem Relation Age of Onset  . Heart disease Mother   . Diabetes Mother   . Cancer Father   . Cancer Sister     breast  . Cancer Brother 48    pancreatic cancer   History  Sexual Activity  . Sexual activity: Not on file    Outpatient Encounter Prescriptions as of 06/24/2016  Medication Sig  . Ascorbic Acid (VITAMIN C PO) Take 1 tablet by mouth every morning.  Marland Kitchen aspirin 325 MG tablet Take 325 mg by mouth daily.  Marland Kitchen b complex vitamins tablet Take 1 tablet by mouth every morning.   . benazepril (LOTENSIN) 10 MG tablet TAKE ONE TABLET BY MOUTH ONCE DAILY (Patient taking differently: TAKE 10 MG BY MOUTH ONCE DAILY)  . Calcium Carbonate-Vitamin D (CALCIUM + D PO) Take 1 tablet by mouth every morning.   . hydrochlorothiazide (MICROZIDE)  12.5 MG capsule TAKE ONE CAPSULE BY MOUTH ONCE DAILY  . lovastatin (MEVACOR) 20 MG tablet TAKE ONE TABLET BY MOUTH AT BEDTIME  . metFORMIN (GLUCOPHAGE) 500 MG tablet TAKE ONE TABLET BY MOUTH TWICE DAILY  . diclofenac (VOLTAREN) 75 MG EC tablet Take 1 tablet (75 mg total) by mouth at bedtime as needed for moderate pain. Take with food (Patient not taking: Reported on 06/24/2016)  . HYDROcodone-acetaminophen (NORCO) 10-325 MG tablet Take 1 tablet by mouth every 4 (four) hours as needed for moderate pain. (Patient not taking: Reported on 06/24/2016)  . naproxen sodium (ANAPROX) 220 MG tablet Take 220 mg by mouth as needed (for pain). Reported on 01/01/2016  . silver sulfADIAZINE (SILVADENE) 1 % cream Apply 1 application topically daily. (Patient not taking: Reported on 06/24/2016)   No facility-administered encounter medications on file as of 06/24/2016.     Activities of Daily Living In your present state of health, do you have any difficulty performing the following activities: 06/24/2016 03/19/2016  Hearing? N N  Vision? N N  Difficulty concentrating or making decisions? Y N  Walking or climbing stairs? N N  Dressing or bathing? N N  Doing errands, shopping? N N  Preparing Food and eating ? N -  Using the Toilet? N -  In the past six months, have you accidently leaked urine? Y -  Do you have problems with loss of bowel control? N -  Managing your Medications? N -  Managing your Finances? N -  Housekeeping or managing your Housekeeping? N -  Some recent data might be hidden    Patient Care Team: Birdie Sons, MD as PCP - General (Family Medicine) Crista Curb. Gloriann Loan, MD as Consulting Physician (Ophthalmology) Clydell Hakim, MD as Consulting Physician (Anesthesiology) Leandrew Koyanagi, MD as Attending Physician (Orthopedic Surgery)    Assessment:     Exercise Activities and Dietary recommendations Current Exercise Habits: The patient does not participate in regular exercise at present,  Exercise limited by: None identified  Goals    . Increase water intake          Starting 06/24/16, I will continue to drink 4 glasses a day.      Fall Risk Fall Risk  06/24/2016 04/30/2016 04/19/2015  Falls in the past year? No No Yes  Number falls in past yr: - - 1  Injury with Fall? - - No   Depression Screen PHQ 2/9 Scores 06/24/2016 04/30/2016 04/19/2015  PHQ - 2 Score 0 0 0     Cognitive Function     6CIT Screen 06/24/2016  What Year? 0 points  What month? 0 points  What time? 0 points  Count back from 20 0 points  Months in reverse 0 points  Repeat phrase 2 points  Total Score 2    Immunization History  Administered Date(s) Administered  . Influenza, High Dose Seasonal PF 05/24/2015, 04/30/2016  . Pneumococcal Conjugate-13 04/28/2014  . Pneumococcal Polysaccharide-23 08/15/2010   Screening Tests Health Maintenance  Topic Date Due  . TETANUS/TDAP  05/24/2026 (Originally 10/30/1959)  . ZOSTAVAX  06/24/2026 (Originally 10/29/2000)  . FOOT EXAM  08/27/2016  . OPHTHALMOLOGY EXAM  10/09/2016  . HEMOGLOBIN A1C  10/28/2016  . COLONOSCOPY  02/09/2023  . INFLUENZA VACCINE  Completed  . DEXA SCAN  Completed  . PNA vac Low Risk Adult  Completed      Plan:  I have personally reviewed and addressed the Medicare Annual Wellness questionnaire and have noted the following in the patient's chart:  A. Medical and social history B. Use of alcohol, tobacco or illicit drugs  C. Current medications and supplements D. Functional ability and status E.  Nutritional status F.  Physical activity G. Advance directives H. List of other physicians I.  Hospitalizations, surgeries, and ER visits in previous 12 months J.  Downs such as hearing and vision if needed, cognitive and depression L. Referrals and appointments - none  In addition, I have reviewed and discussed with patient certain preventive protocols, quality metrics, and best practice recommendations. A  written personalized care plan for preventive services as well as general preventive health recommendations were provided to patient.  See attached scanned questionnaire for additional information.   Signed,  Fabio Neighbors, LPN Nurse Health Advisor  MD recommendation: none   I have reviewed the health advisor's note, was available for consultation, and agree with documentation and plan  Lelon Huh, MD

## 2016-06-24 NOTE — Progress Notes (Signed)
       Patient: Suzanne Faulkner Female    DOB: Sep 03, 1940   75 y.o.   MRN: [id number] Visit Date: 06/24/2016  Today's Provider: Lelon Huh, MD   Chief Complaint  Patient presents with  . Follow-up   Subjective:    HPI  Patient here for AWV completed by [facility] (se separate note) She has no complaints today. She is scheduled for mammogram tomorrow. She refused breast exam today. She will be due for BMD next year and colonoscopy in 2019.      Allergies  Allergen Reactions  . No Known Allergies Other (See Comments)     Current Outpatient Prescriptions:  .  Ascorbic Acid (VITAMIN C PO), Take 1 tablet by mouth every morning., Disp: , Rfl:  .  aspirin 325 MG tablet, Take 325 mg by mouth daily., Disp: , Rfl:  .  b complex vitamins tablet, Take 1 tablet by mouth every morning. , Disp: , Rfl:  .  benazepril (LOTENSIN) 10 MG tablet, TAKE ONE TABLET BY MOUTH ONCE DAILY (Patient taking differently: TAKE 10 MG BY MOUTH ONCE DAILY), Disp: 90 tablet, Rfl: 1 .  Calcium Carbonate-Vitamin D (CALCIUM + D PO), Take 1 tablet by mouth every morning. , Disp: , Rfl:  .  diclofenac (VOLTAREN) 75 MG EC tablet, Take 1 tablet (75 mg total) by mouth at bedtime as needed for moderate pain. Take with food (Patient not taking: Reported on 06/24/2016), Disp: 30 tablet, Rfl: 0 .  hydrochlorothiazide (MICROZIDE) 12.5 MG capsule, TAKE ONE CAPSULE BY MOUTH ONCE DAILY, Disp: 180 capsule, Rfl: 1 .  HYDROcodone-acetaminophen (NORCO) 10-325 MG tablet, Take 1 tablet by mouth every 4 (four) hours as needed for moderate pain. (Patient not taking: Reported on 06/24/2016), Disp: 50 tablet, Rfl: 0 .  lovastatin (MEVACOR) 20 MG tablet, TAKE ONE TABLET BY MOUTH AT BEDTIME, Disp: 90 tablet, Rfl: 1 .  metFORMIN (GLUCOPHAGE) 500 MG tablet, TAKE ONE TABLET BY MOUTH TWICE DAILY, Disp: 180 tablet, Rfl: 1 .  naproxen sodium (ANAPROX) 220 MG tablet, Take 220 mg by mouth as needed (for pain). Reported on 01/01/2016, Disp: , Rfl:  .  silver  sulfADIAZINE (SILVADENE) 1 % cream, Apply 1 application topically daily. (Patient not taking: Reported on 06/24/2016), Disp: 50 g, Rfl: 0  Review of Systems  Social History  Substance Use Topics  . Smoking status: Former Smoker    Packs/day: 0.50    Types: Cigarettes    Quit date: 01/08/2014  . Smokeless tobacco: Never Used  . Alcohol use No   Objective:     Vitals:  BP  139/91 (BP Location: Right Arm)   Pulse 60   Temp 98.9 F (37.2 C) (Oral)   Ht 5\' 6"  (1.676 m)   Wt 256 lb 4 oz (116.2 kg)   BMI 41.36 kg/m   BSA 2.33 m  Physical Exam  n/a     Assessment & Plan:     AWV- see separate not by [facility] Follow up for diabetes in 3-4 months.       Lelon Huh, MD  [facility]

## 2016-06-24 NOTE — Patient Instructions (Signed)
Suzanne Faulkner , Thank you for taking time to come for your Medicare Wellness Visit. I appreciate your ongoing commitment to your health goals. Please review the following plan we discussed and let me know if I can assist you in the future.   These are the goals we discussed: Goals    . Increase water intake          Starting 06/24/16, I will continue to drink 4 glasses a day.       This is a list of the screening recommended for you and due dates:  Health Maintenance  Topic Date Due  . Tetanus Vaccine  10/30/1959  . Shingles Vaccine  10/29/2000  . Complete foot exam   08/27/2016  . Eye exam for diabetics  10/09/2016  . Hemoglobin A1C  10/28/2016  . Colon Cancer Screening  02/09/2023  . Flu Shot  Completed  . DEXA scan (bone density measurement)  Completed  . Pneumonia vaccines  Completed   Preventive Care for Adults  A healthy lifestyle and preventive care can promote health and wellness. Preventive health guidelines for adults include the following key practices.  . A routine yearly physical is a good way to check with your health care provider about your health and preventive screening. It is a chance to share any concerns and updates on your health and to receive a thorough exam.  . Visit your dentist for a routine exam and preventive care every 6 months. Brush your teeth twice a day and floss once a day. Good oral hygiene prevents tooth decay and gum disease.  . The frequency of eye exams is based on your age, health, family medical history, use  of contact lenses, and other factors. Follow your health care provider's ecommendations for frequency of eye exams.  . Eat a healthy diet. Foods like vegetables, fruits, whole grains, low-fat dairy products, and lean protein foods contain the nutrients you need without too many calories. Decrease your intake of foods high in solid fats, added sugars, and salt. Eat the right amount of calories for you. Get information about a proper diet  from your health care provider, if necessary.  . Regular physical exercise is one of the most important things you can do for your health. Most adults should get at least 150 minutes of moderate-intensity exercise (any activity that increases your heart rate and causes you to sweat) each week. In addition, most adults need muscle-strengthening exercises on 2 or more days a week.  Silver Sneakers may be a benefit available to you. To determine eligibility, you may visit the website: www.silversneakers.com or contact program at 419-295-9770 Mon-Fri between 8AM-8PM.   . Maintain a healthy weight. The body mass index (BMI) is a screening tool to identify possible weight problems. It provides an estimate of body fat based on height and weight. Your health care provider can find your BMI and can help you achieve or maintain a healthy weight.   For adults 20 years and older: ? A BMI below 18.5 is considered underweight. ? A BMI of 18.5 to 24.9 is normal. ? A BMI of 25 to 29.9 is considered overweight. ? A BMI of 30 and above is considered obese.   . Maintain normal blood lipids and cholesterol levels by exercising and minimizing your intake of saturated fat. Eat a balanced diet with plenty of fruit and vegetables. Blood tests for lipids and cholesterol should begin at age 17 and be repeated every 5 years. If your lipid or  cholesterol levels are high, you are over 50, or you are at high risk for heart disease, you may need your cholesterol levels checked more frequently. Ongoing high lipid and cholesterol levels should be treated with medicines if diet and exercise are not working.  . If you smoke, find out from your health care provider how to quit. If you do not use tobacco, please do not start.  . If you choose to drink alcohol, please do not consume more than 2 drinks per day. One drink is considered to be 12 ounces (355 mL) of beer, 5 ounces (148 mL) of wine, or 1.5 ounces (44 mL) of liquor.  .  If you are 2-14 years old, ask your health care provider if you should take aspirin to prevent strokes.  . Use sunscreen. Apply sunscreen liberally and repeatedly throughout the day. You should seek shade when your shadow is shorter than you. Protect yourself by wearing long sleeves, pants, a wide-brimmed hat, and sunglasses year round, whenever you are outdoors.  . Once a month, do a whole body skin exam, using a mirror to look at the skin on your back. Tell your health care provider of new moles, moles that have irregular borders, moles that are larger than a pencil eraser, or moles that have changed in shape or color.

## 2016-06-25 ENCOUNTER — Ambulatory Visit
Admission: RE | Admit: 2016-06-25 | Discharge: 2016-06-25 | Disposition: A | Discharge status: Home / Self Care | Payer: Medicare Other | Source: Ambulatory Visit | Attending: Family Medicine | Admitting: Family Medicine

## 2016-06-25 DIAGNOSIS — Z1231 Encounter for screening mammogram for malignant neoplasm of breast: Secondary | ICD-10-CM

## 2016-06-26 ENCOUNTER — Encounter (INDEPENDENT_AMBULATORY_CARE_PROVIDER_SITE_OTHER): Payer: Self-pay | Admitting: Orthopaedic Surgery

## 2016-06-26 ENCOUNTER — Ambulatory Visit (INDEPENDENT_AMBULATORY_CARE_PROVIDER_SITE_OTHER): Payer: Medicare Other | Admitting: Orthopaedic Surgery

## 2016-06-26 DIAGNOSIS — M25511 Pain in right shoulder: Secondary | ICD-10-CM | POA: Diagnosis not present

## 2016-06-26 NOTE — Progress Notes (Signed)
   Office Visit Note   Patient: Tanisha Lundblad           Date of Birth: 1941-05-07           MRN: [id number] Visit Date: 06/26/2016              Requested by: Birdie Sons, MD [address] [place], [place] [address] PCP: Lelon Huh, MD   Assessment & Plan: Visit Diagnoses:  1. Sternoclavicular joint pain, right     Plan:  - CT scan shows DJD of right [place] joint, no aggressive features - incidental finding of thyroid nodule - instructed patient to see PCP about work up - f/u prn  Follow-Up Instructions: Return if symptoms worsen or fail to improve.   Orders:  No orders of the defined types were placed in this encounter.  No orders of the defined types were placed in this encounter.     Procedures: No procedures performed   Clinical Data: No additional findings.   Subjective: Chief Complaint  Patient presents with  . Right Shoulder - Pain    HPI  75 yo female here for f/u of CT scan.  Right [place] joint is feeling much better.  Able to turn her head better now.   Review of Systems   Objective: Vital Signs: There were no vitals taken for this visit.  Physical Exam  Ortho Exam Right [place] joint is mildly more prominent.  Not really tender. Specialty Comments:  No specialty comments available.  Imaging: No results found.   PMFS History: Patient Active Problem List   Diagnosis Date Noted  . Sternoclavicular joint pain, right 06/02/2016  . Chronic back pain 05/02/2016  . Hyperlipidemia 04/04/2015  . Personal history of colonic polyps 02/02/2013  . TIA (transient ischemic attack) 01/25/2013  . HTN (hypertension) 01/25/2013  . Tobacco abuse, in remission 01/25/2013  . DM type 2 (diabetes mellitus, type 2) ([facility]) 01/25/2013  . Bradycardia 01/25/2013   Past Medical History:  Diagnosis Date  . Colon polyps 2009  . Nocturia     Family History  Problem Relation Age of Onset  . Heart disease Mother   . Diabetes Mother   . Cancer Father   .  Cancer Sister     breast  . Breast cancer Sister   . Cancer Brother 60    pancreatic cancer    Past Surgical History:  Procedure Laterality Date  . ABDOMINAL HYSTERECTOMY    . CHOLECYSTECTOMY    . goiter removal    . JOINT REPLACEMENT Left    knee  . SPINAL CORD STIMULATOR INSERTION N/A 03/21/2016   Procedure: LUMBAR SPINAL CORD STIMULATOR INSERTION;  Surgeon: Clydell Hakim, MD;  Location: [facility];  Service: Neurosurgery;  Laterality: N/A;  . TONSILLECTOMY     Social History   Occupational History  . Not on file.   Social History Main Topics  . Smoking status: Former Smoker    Packs/day: 0.50    Types: Cigarettes    Quit date: 01/08/2014  . Smokeless tobacco: Never Used  . Alcohol use No  . Drug use: No  . Sexual activity: Not on file

## 2016-07-01 ENCOUNTER — Encounter: Payer: Self-pay | Admitting: Family Medicine

## 2016-07-01 ENCOUNTER — Telehealth: Payer: Self-pay | Admitting: Family Medicine

## 2016-07-01 DIAGNOSIS — E041 Nontoxic single thyroid nodule: Secondary | ICD-10-CM | POA: Insufficient documentation

## 2016-07-01 NOTE — Telephone Encounter (Signed)
Patient needs TSH and T4 due to finding of thyroid nodule on recent chest CT. Please advise and print order for patient to pick up at front desk.

## 2016-07-01 NOTE — Telephone Encounter (Addendum)
Patient was notified and lab slip printed.

## 2016-07-07 DIAGNOSIS — E041 Nontoxic single thyroid nodule: Secondary | ICD-10-CM | POA: Diagnosis not present

## 2016-07-08 ENCOUNTER — Telehealth: Payer: Self-pay

## 2016-07-08 ENCOUNTER — Other Ambulatory Visit: Payer: Self-pay | Admitting: Family Medicine

## 2016-07-08 DIAGNOSIS — E041 Nontoxic single thyroid nodule: Secondary | ICD-10-CM

## 2016-07-08 LAB — T4 AND TSH
T4 TOTAL: 5.8 ug/dL (ref 4.5–12.0)
TSH: 0.907 u[IU]/mL (ref 0.450–4.500)

## 2016-07-08 NOTE — Progress Notes (Unsigned)
Please schedule thyroid ultrasound to evaluate nodules seen on chest CT in October.

## 2016-07-08 NOTE — Telephone Encounter (Signed)
-----   Message from Birdie Sons, MD sent at 07/08/2016  8:11 AM EST ----- Labs show thyroid is functioning normally. Need ultrasound of thyroid to further evaluate thyroid nodule that was seen on chest CT, to make sure it is benign. Have entered order for sarah to schedule. Please advised patient.

## 2016-07-08 NOTE — Telephone Encounter (Signed)
Pt has been advised as below. Renaldo Fiddler, CMA

## 2016-07-17 ENCOUNTER — Ambulatory Visit (HOSPITAL_COMMUNITY)
Admission: RE | Admit: 2016-07-17 | Discharge: 2016-07-17 | Disposition: A | Discharge status: Home / Self Care | Payer: Medicare Other | Source: Ambulatory Visit | Attending: Family Medicine | Admitting: Family Medicine

## 2016-07-17 DIAGNOSIS — E041 Nontoxic single thyroid nodule: Secondary | ICD-10-CM

## 2016-07-17 DIAGNOSIS — E042 Nontoxic multinodular goiter: Secondary | ICD-10-CM | POA: Diagnosis not present

## 2016-07-21 ENCOUNTER — Telehealth: Payer: Self-pay | Admitting: Family Medicine

## 2016-07-21 DIAGNOSIS — E041 Nontoxic single thyroid nodule: Secondary | ICD-10-CM

## 2016-07-21 NOTE — Telephone Encounter (Signed)
Please advise 

## 2016-07-21 NOTE — Telephone Encounter (Signed)
Please schedule referral to ENT for multiple thyroid nodules that needs further evaluation and likely a biopsy. Thanks!

## 2016-07-21 NOTE — Telephone Encounter (Signed)
Pt called saying someone here called her Friday.  Please call pt back at [phone]  She did not understand the message or who called her.    Thanks Con Memos

## 2016-07-21 NOTE — Telephone Encounter (Signed)
Patient was notified of results for [place] of thyroid. Patient is agreeable to referral to ENT.

## 2016-07-24 ENCOUNTER — Encounter: Payer: Self-pay | Admitting: *Deleted

## 2016-07-24 ENCOUNTER — Encounter: Payer: Self-pay | Admitting: Family Medicine

## 2016-08-08 DIAGNOSIS — E041 Nontoxic single thyroid nodule: Secondary | ICD-10-CM | POA: Diagnosis not present

## 2016-08-13 ENCOUNTER — Other Ambulatory Visit: Payer: Self-pay | Admitting: Otolaryngology

## 2016-08-13 DIAGNOSIS — E041 Nontoxic single thyroid nodule: Secondary | ICD-10-CM

## 2016-08-19 ENCOUNTER — Other Ambulatory Visit: Payer: Self-pay | Admitting: Otolaryngology

## 2016-08-19 ENCOUNTER — Ambulatory Visit
Admission: RE | Admit: 2016-08-19 | Discharge: 2016-08-19 | Disposition: A | Discharge status: Home / Self Care | Payer: Medicare Other | Source: Ambulatory Visit | Attending: Otolaryngology | Admitting: Otolaryngology

## 2016-08-19 DIAGNOSIS — E042 Nontoxic multinodular goiter: Secondary | ICD-10-CM | POA: Insufficient documentation

## 2016-08-19 DIAGNOSIS — E041 Nontoxic single thyroid nodule: Secondary | ICD-10-CM

## 2016-08-19 DIAGNOSIS — D440 Neoplasm of uncertain behavior of thyroid gland: Secondary | ICD-10-CM | POA: Diagnosis not present

## 2016-08-19 HISTORY — DX: Type 2 diabetes mellitus without complications: E11.9

## 2016-08-19 HISTORY — DX: Personal history of urinary calculi: Z87.442

## 2016-08-19 HISTORY — DX: Unspecified osteoarthritis, unspecified site: M19.90

## 2016-08-19 NOTE — Discharge Instructions (Signed)
Thyroid Biopsy, Care After °Refer to this sheet in the next few weeks. These instructions provide you with information on caring for yourself after your procedure. Your health care provider may also give you more specific instructions. Your treatment has been planned according to current medical practices, but problems sometimes occur. Call your health care provider if you have any problems or questions after your procedure. °What can I expect after the procedure? °After your procedure, it is typical to have the following: °· You may have soreness and tenderness at the biopsy site for a few days. °· You may have a sore throat or a hoarse voice if you had an open biopsy. This should go away after a couple days. °Follow these instructions at home: °· Take medicines only as directed by your health care provider. °· To ease discomfort at the biopsy site: °¨ Keep your head raised on a pillow when you are lying down. °¨ Support the back of your head and neck with both hands as you sit up from a lying position. °· If you have a sore throat, try using throat lozenges or gargling with warm salt water. °· Keep all follow-up visits as directed by your health care provider. This is important. °Contact a health care provider if: °· You have a fever. °Get help right away if: °· You have severe bleeding from the biopsy site. °· You have difficulty swallowing. °· You have drainage, redness, swelling, or pain at the biopsy site. °· You have swollen glands (lymph nodes) in your neck. °This information is not intended to replace advice given to you by your health care provider. Make sure you discuss any questions you have with your health care provider. °Document Released: 02/22/2014 Document Revised: 03/30/2016 Document Reviewed: 10/20/2013 °Elsevier Interactive Patient Education © 2017 [employer] ° °

## 2016-09-02 ENCOUNTER — Other Ambulatory Visit: Payer: Self-pay | Admitting: Family Medicine

## 2016-09-02 DIAGNOSIS — I10 Essential (primary) hypertension: Secondary | ICD-10-CM

## 2016-09-04 LAB — CYTOLOGY - NON PAP

## 2016-09-16 ENCOUNTER — Encounter: Payer: Self-pay | Admitting: Otolaryngology

## 2016-10-22 ENCOUNTER — Encounter: Payer: Self-pay | Admitting: Family Medicine

## 2016-10-22 ENCOUNTER — Ambulatory Visit (INDEPENDENT_AMBULATORY_CARE_PROVIDER_SITE_OTHER): Payer: Medicare Other | Admitting: Family Medicine

## 2016-10-22 VITALS — BP 130/72 | HR 60 | Temp 98.6°F | Resp 16 | Wt 257.0 lb

## 2016-10-22 DIAGNOSIS — M545 Low back pain, unspecified: Secondary | ICD-10-CM

## 2016-10-22 DIAGNOSIS — I10 Essential (primary) hypertension: Secondary | ICD-10-CM | POA: Diagnosis not present

## 2016-10-22 DIAGNOSIS — E119 Type 2 diabetes mellitus without complications: Secondary | ICD-10-CM

## 2016-10-22 DIAGNOSIS — M48061 Spinal stenosis, lumbar region without neurogenic claudication: Secondary | ICD-10-CM | POA: Diagnosis not present

## 2016-10-22 DIAGNOSIS — E785 Hyperlipidemia, unspecified: Secondary | ICD-10-CM

## 2016-10-22 DIAGNOSIS — M79605 Pain in left leg: Secondary | ICD-10-CM | POA: Insufficient documentation

## 2016-10-22 LAB — POCT GLYCOSYLATED HEMOGLOBIN (HGB A1C)
Est. average glucose Bld gHb Est-mCnc: 126
Hemoglobin A1C: 6

## 2016-10-22 NOTE — Progress Notes (Signed)
Patient: Suzanne Faulkner Female    DOB: 1941-03-11   76 y.o.   MRN: [id number] Visit Date: 10/22/2016  Today's Provider: Lelon Huh, MD   Chief Complaint  Patient presents with  . Diabetes  . Hypertension  . Hyperlipidemia   Subjective:    HPI  Diabetes Mellitus Type II, Follow-up:   Lab Results  Component Value Date   HGBA1C 6.0 10/22/2016   HGBA1C 5.9 04/30/2016   HGBA1C 6.0 (H) 03/19/2016    Last seen for diabetes 5 months ago.  Management since then includes no changes. She reports good compliance with treatment. She is not having side effects.  Current symptoms include none and have been stable. Home blood sugar records: not being checked.  Episodes of hypoglycemia? no   Current Insulin Regimen: none Most Recent Eye Exam: annually Weight trend: stable Prior visit with dietician: no Current diet: well balanced Current exercise: none  Pertinent Labs:    Component Value Date/Time   CHOL 132 05/05/2016 0920   TRIG 101 05/05/2016 0920   HDL 61 05/05/2016 0920   LDLCALC 51 05/05/2016 0920   CREATININE 1.43 (H) 05/26/2016 1610    Wt Readings from Last 3 Encounters:  10/22/16 257 lb (116.6 kg)  08/19/16 258 lb (117 kg)  06/24/16 256 lb 4 oz (116.2 kg)      Hypertension, follow-up:  BP Readings from Last 3 Encounters:  10/22/16 130/72  08/19/16 (!) 157/97  06/24/16 (!) 139/91    She was last seen for hypertension 5 months ago.  BP at that visit was 156/90. Management since that visit includes no changes. She reports good compliance with treatment. She is not having side effects.  She is not exercising. She is adherent to low salt diet.   Outside blood pressures are not being checked. She is experiencing none.  Patient denies exertional chest pressure/discomfort, lower extremity edema and palpitations.   Cardiovascular risk factors include diabetes mellitus and dyslipidemia.     Weight trend: stable Wt Readings from Last 3 Encounters:   10/22/16 257 lb (116.6 kg)  08/19/16 258 lb (117 kg)  06/24/16 256 lb 4 oz (116.2 kg)    Current diet: well balanced     Lipid/Cholesterol, Follow-up:   Last seen for this5 months ago.  Management changes since that visit include no changes. . Last Lipid Panel:    Component Value Date/Time   CHOL 132 05/05/2016 0920   TRIG 101 05/05/2016 0920   HDL 61 05/05/2016 0920   CHOLHDL 2.2 05/05/2016 0920   CHOLHDL 2.9 01/26/2013 0543   VLDL 25 01/26/2013 0543   LDLCALC 51 05/05/2016 0920    Risk factors for vascular disease include diabetes mellitus and hypertension  She reports good compliance with treatment. She is not having side effects.  Current symptoms include none and have been stable. Weight trend: stable Prior visit with dietician: no Current diet: well balanced Current exercise: none  Wt Readings from Last 3 Encounters:  10/22/16 257 lb (116.6 kg)  08/19/16 258 lb (117 kg)  06/24/16 256 lb 4 oz (116.2 kg)      She also reports chronic daily back pain radiating to the back of her left leg. Had spinal stimulator placed at pain clinic last August, but is no longer being followed. Was previously followed by Dr. Christella Noa for multilevel spinal stenosis. She is interested in referral for physical therapy.     Allergies  Allergen Reactions  . No Known Allergies Other (See  Comments)     Current Outpatient Prescriptions:  .  Ascorbic Acid (VITAMIN C PO), Take 1 tablet by mouth every morning., Disp: , Rfl:  .  aspirin 325 MG tablet, Take 325 mg by mouth daily., Disp: , Rfl:  .  b complex vitamins tablet, Take 1 tablet by mouth every morning. , Disp: , Rfl:  .  benazepril (LOTENSIN) 10 MG tablet, TAKE ONE TABLET BY MOUTH ONCE DAILY, Disp: 90 tablet, Rfl: 4 .  Calcium Carbonate-Vitamin D (CALCIUM + D PO), Take 1 tablet by mouth every morning. , Disp: , Rfl:  .  hydrochlorothiazide (MICROZIDE) 12.5 MG capsule, TAKE ONE CAPSULE BY MOUTH ONCE DAILY, Disp: 180 capsule,  Rfl: 1 .  lovastatin (MEVACOR) 20 MG tablet, TAKE ONE TABLET BY MOUTH AT BEDTIME, Disp: 90 tablet, Rfl: 1 .  metFORMIN (GLUCOPHAGE) 500 MG tablet, TAKE ONE TABLET BY MOUTH TWICE DAILY, Disp: 180 tablet, Rfl: 1 .  naproxen sodium (ANAPROX) 220 MG tablet, Take 220 mg by mouth as needed (for pain). Reported on 01/01/2016, Disp: , Rfl:  .  silver sulfADIAZINE (SILVADENE) 1 % cream, Apply 1 application topically daily., Disp: 50 g, Rfl: 0 .  diclofenac (VOLTAREN) 75 MG EC tablet, Take 1 tablet (75 mg total) by mouth at bedtime as needed for moderate pain. Take with food (Patient not taking: Reported on 08/19/2016), Disp: 30 tablet, Rfl: 0 .  HYDROcodone-acetaminophen (NORCO) 10-325 MG tablet, Take 1 tablet by mouth every 4 (four) hours as needed for moderate pain. (Patient not taking: Reported on 08/19/2016), Disp: 50 tablet, Rfl: 0  Review of Systems  Constitutional: Negative for appetite change, chills, fatigue and fever.  Respiratory: Negative for chest tightness and shortness of breath.   Cardiovascular: Negative for chest pain and palpitations.  Gastrointestinal: Negative for abdominal pain, nausea and vomiting.  Neurological: Negative for dizziness and weakness.    Social History  Substance Use Topics  . Smoking status: Former Smoker    Packs/day: 0.50    Types: Cigarettes    Quit date: 01/08/2014  . Smokeless tobacco: Never Used  . Alcohol use No   Objective:   BP 130/72 (BP Location: Left Arm, Patient Position: Sitting, Cuff Size: Normal)   Pulse 60   Temp 98.6 F (37 C)   Resp 16   Wt 257 lb (116.6 kg)   BMI 41.48 kg/m  Physical Exam   General Appearance:    Alert, cooperative, no distress  Eyes:    PERRL, conjunctiva/corneas clear, EOM's intact       Lungs:     Clear to auscultation bilaterally, respirations unlabored  Heart:    Regular rate and rhythm  Neurologic:   Awake, alert, oriented x 3. No apparent focal neurological           defect.       Results for orders placed  or performed in visit on 10/22/16  POCT glycosylated hemoglobin (Hb A1C)  Result Value Ref Range   Hemoglobin A1C 6.0    Est. average glucose Bld gHb Est-mCnc 126        Assessment & Plan:     1. Type 2 diabetes mellitus without complication, without long-term current use of insulin (HCC) Well controlled.  Continue current medications.   - POCT glycosylated hemoglobin (Hb A1C)  2. Hyperlipidemia, unspecified hyperlipidemia type .She is tolerating lovastatin well with no adverse effects.    3. Essential hypertension Well controlled.  Continue current medications.    4. Low back pain radiating to  left leg due to spinal stenosis.   - Ambulatory referral to Physical Therapy  Return in about 6 months (around 04/24/2017).  The entirety of the information documented in the History of Present Illness, Review of Systems and Physical Exam were personally obtained by me. Portions of this information were initially documented by Wilburt Finlay, CMA and reviewed by me for thoroughness and accuracy.        Lelon Huh, MD  [facility]

## 2016-10-23 DIAGNOSIS — M48061 Spinal stenosis, lumbar region without neurogenic claudication: Secondary | ICD-10-CM | POA: Insufficient documentation

## 2016-10-31 ENCOUNTER — Other Ambulatory Visit: Payer: Self-pay | Admitting: Physician Assistant

## 2016-10-31 DIAGNOSIS — E785 Hyperlipidemia, unspecified: Secondary | ICD-10-CM

## 2016-10-31 NOTE — Telephone Encounter (Signed)
Please review-aa 

## 2016-11-12 ENCOUNTER — Ambulatory Visit: Payer: Medicare Other | Attending: Family Medicine

## 2016-11-12 VITALS — BP 148/82 | HR 67

## 2016-11-12 DIAGNOSIS — M5417 Radiculopathy, lumbosacral region: Secondary | ICD-10-CM | POA: Diagnosis not present

## 2016-11-12 DIAGNOSIS — M5442 Lumbago with sciatica, left side: Secondary | ICD-10-CM | POA: Insufficient documentation

## 2016-11-12 DIAGNOSIS — M5441 Lumbago with sciatica, right side: Secondary | ICD-10-CM | POA: Diagnosis not present

## 2016-11-12 DIAGNOSIS — G8929 Other chronic pain: Secondary | ICD-10-CM | POA: Diagnosis not present

## 2016-11-12 NOTE — Patient Instructions (Signed)
  Quadriceps Set (Prone)   With toes supporting lower legs, squeeze rear end muscles and tighten thigh muscles to straighten knees. Hold _5___ seconds. Relax. Repeat __10__ times per set. Do ___3_ sets per session. Do ___1_ sessions per day.  http://orth.exer.us/726   Copyright  VHI. All rights reserved.   

## 2016-11-12 NOTE — Therapy (Signed)
[facility] [address] [place], [address] Phone: [phone]   Fax:  [phone]  Physical Therapy Evaluation  Patient Details  Name: Suzanne Faulkner MRN: [id number] Date of Birth: June 10, 1941 Referring Provider: Kirstie Peri. Caryn Section, MD  Encounter Date: 11/12/2016      PT End of Session - 11/12/16 1350    Visit Number 1   Number of Visits 13   Date for PT Re-Evaluation 12/25/16   Authorization Type 1   Authorization Time Period of 10 g code   PT Start Time 1350   PT Stop Time 1446   PT Time Calculation (min) 56 min   Activity Tolerance Patient tolerated treatment well   Behavior During Therapy WFL for tasks assessed/performed      Past Medical History:  Diagnosis Date  . Arthritis   . Colon polyps 2009  . Diabetes mellitus without complication ([place])   . History of kidney stones   . Hypertension   . Nocturia     Past Surgical History:  Procedure Laterality Date  . ABDOMINAL HYSTERECTOMY    . CHOLECYSTECTOMY    . goiter removal    . JOINT REPLACEMENT Left    knee  . SPINAL CORD STIMULATOR INSERTION N/A 03/21/2016   Procedure: LUMBAR SPINAL CORD STIMULATOR INSERTION;  Surgeon: Clydell Hakim, MD;  Location: [facility];  Service: Neurosurgery;  Laterality: N/A;  . TONSILLECTOMY      Vitals:   11/12/16 1358  BP: (!) 148/82  Pulse: 67         Subjective Assessment - 11/12/16 1359    Subjective Bilateral posterior hip and thigh pain: 0/10 (pt currently sitting) 9/10 at worst (6-7 steps). L hip and thigh bothers her more.    Pertinent History Low back pain with radiating symptoms. Feels pain both posterior hips and thighs. Has an electrical stimulator implant for her back (03/21/2016). Currently does not have back pain but feels symptoms at her hips and thighs. Has had pain for years. Pain worsened January of this year. Tried getting her stimulator reprogrammed which did not help. The stimulator used  to help when she first got it but symptoms eventually worsened. Denies bowel or bladder problems or saddle anesthesia.    Patient Stated Goals I want to walk better without pain.    Currently in Pain? Yes   Pain Score 0-No pain   Pain Location --  bilateral posterior hip and thighs   Pain Orientation Right;Left   Pain Descriptors / Indicators Stabbing;Pins and needles   Pain Type Chronic pain   Pain Onset More than a month ago   Pain Frequency Occasional   Aggravating Factors  walking (1 step)   Pain Relieving Factors sitting, lying down on her back            [facility] PT Assessment - 11/12/16 1353      Assessment   Medical Diagnosis Low back pain radiating to L leg   Referring Provider Kirstie Peri. Caryn Section, MD   Onset Date/Surgical Date 08/11/16   Prior Therapy No known PT for current condition     Precautions   Precaution Comments Electrical stimulator implant     Restrictions   Other Position/Activity Restrictions No modalities to back due to electrical stimulator implant.      Balance Screen   Has the patient fallen in the past 6 months No   Has the patient had a decrease in activity level because of a fear  of falling?  No   Is the patient reluctant to leave their home because of a fear of falling?  No     Home Environment   Additional Comments Patient lives in a 1 story home with daughter. No steps to enter.      Prior Function   Vocation Retired   [employer] PLOF: better able to ambulate greater than 7 steps with less bilateral posterior hip and thigh pain.      Observation/Other Assessments   Observations Prone position posture: L pelvis rotation   Modified [place] 40%     Posture/Postural Control   Posture Comments R lateral lean, deceased bilateral hip extension L > R, R lateral shift, bilaterally protracted shoulders and neck     AROM   Overall AROM Comments limited bilateral hip extension observed   Lumbar Flexion Full with bilateral posterior hip  and thigh symptoms   Lumbar Extension limited with reproduction of symptoms (more than lumbar flexion)   Lumbar - Right Side Bend limited with pain at the return motion   Lumbar - Left Side Bend limited with pain at the return motion   Lumbar - Right Rotation WFL   Lumbar - Left Rotation WFL     PROM   Overall PROM Comments L hip extension: -5 degrees; R hip extension: 3 degrees     Strength   Right Hip Flexion 4/5   Right Hip Extension 3-/5   Right Hip ABduction 3-/5   Left Hip Flexion 4/5   Left Hip Extension 3+/5   Left Hip ABduction 3+/5   Right Knee Flexion 5/5   Right Knee Extension 5/5   Left Knee Flexion 5/5   Left Knee Extension 5/5     Ambulation/Gait   Gait Comments SPC on R, R lateral lean during R LE stance phase, antalgic with decreased stance on R LE, foreward flexed, R lateral lean, decreased bilateral hip extension     Objectives  There-ex  Directed patient with prone glute max/quad set 10x with 5 seocnds, then 8x5 seconds Seated hip adduction ball squeeze with glute max squeeze 10x2 with 5 second holds.  Reviewed HEP. Pt demonstrated and verbalized understanding.   Improved exercise technique, movement at target joints, use of target muscles after mod verbal, visual, tactile cues.     Decreased L LE symptoms with gait after session.                    PT Education - 11/12/16 1859    Education provided Yes   Education Details ther-ex, HEP, plan of care   Person(s) Educated Patient   Methods Explanation;Demonstration;Tactile cues;Verbal cues;Handout   Comprehension Verbalized understanding;Returned demonstration             PT Long Term Goals - 11/12/16 1838      PT LONG TERM GOAL #1   Title Patient will have a decrease in bilateral posterior hip and thigh pain to 4/10 or less at worst to promote ability to ambulate longer distances.    Baseline 9/10 pain at worst (11/12/2016)   Time 6   Period Weeks   Status New     PT LONG  TERM GOAL #2   Title Patient will improve bilateral glute max and med strength by at least 1/2 MMT to promote ability to ambulate with less posterior hip and thigh pain.    Time 6   Period Weeks   Status New     PT LONG TERM GOAL #3  Title Patient will report being able to ambulate at least 20 steps with [facility] with 4/10 bilateral posterior hip and thigh pain to promote mobility.   Baseline 9/10 pain when walking 6-7 steps (11/12/2016)   Time 6   Period Weeks   Status New               Plan - 11/12/16 1833    Clinical Impression Statement Patient is a 76 year old female who came to phyiscal therapy secondary to bilateral posterior hip and thigh pain. She also presents with reproduction of symptoms with lumbar AROM with extension being the most symptom reproducing movement; altered gait pattern and posture, bilateral glute weakness, decreased bilateral femoral control, and difficulty performing functional tasks such as walking due to pain. Patient will benefit from skilled physical therapy services to address the aforementioned deficits.    Rehab Potential Fair   Clinical Impairments Affecting Rehab Potential (-) chronicity of condition; (+) motivated   PT Frequency 2x / week   PT Duration 6 weeks   PT Treatment/Interventions Gait training;Therapeutic activities;Therapeutic exercise;Neuromuscular re-education;Patient/family education;Manual techniques   PT Next Visit Plan glute max, med, trunk strengthening, gait   Consulted and Agree with Plan of Care Patient      Patient will benefit from skilled therapeutic intervention in order to improve the following deficits and impairments:  Pain, Postural dysfunction, Improper body mechanics, Difficulty walking, Decreased strength  Visit Diagnosis: Chronic low back pain with left-sided sciatica, unspecified back pain laterality - Plan: PT plan of care cert/re-cert  Chronic low back pain with right-sided sciatica, unspecified back pain  laterality - Plan: PT plan of care cert/re-cert  Radiculopathy, lumbosacral region - Plan: PT plan of care cert/re-cert      G-Codes - 93/81/82 1847    Functional Assessment Tool Used (Outpatient Only) Modified Oswestry Low Back Pain Disability Questionnaire, patient interview, clinical presentation   Functional Limitation Mobility: Walking and moving around   Mobility: Walking and Moving Around Current Status ([id number]) At least 40 percent but less than 60 percent impaired, limited or restricted   Mobility: Walking and Moving Around Goal Status [phone]) At least 20 percent but less than 40 percent impaired, limited or restricted       Problem List Patient Active Problem List   Diagnosis Date Noted  . Lumbar spinal stenosis 10/23/2016  . Low back pain radiating to left leg 10/22/2016  . Thyroid nodule 07/01/2016  . Sternoclavicular joint pain, right 06/02/2016  . Chronic back pain 05/02/2016  . Hyperlipidemia 04/04/2015  . Personal history of colonic polyps 02/02/2013  . TIA (transient ischemic attack) 01/25/2013  . HTN (hypertension) 01/25/2013  . Tobacco abuse, in remission 01/25/2013  . DM type 2 (diabetes mellitus, type 2) ([place]) 01/25/2013  . Bradycardia 01/25/2013    Joneen Boers PT, DPT  11/12/2016, 7:03 PM  [facility] [address], [place], [address] Phone: [phone]   Fax:  [phone]  Name: Suzanne Faulkner MRN: [id number] Date of Birth: 1940/10/07

## 2016-11-18 ENCOUNTER — Ambulatory Visit: Payer: Medicare Other

## 2016-11-18 DIAGNOSIS — M5442 Lumbago with sciatica, left side: Principal | ICD-10-CM

## 2016-11-18 DIAGNOSIS — M5441 Lumbago with sciatica, right side: Secondary | ICD-10-CM

## 2016-11-18 DIAGNOSIS — G8929 Other chronic pain: Secondary | ICD-10-CM

## 2016-11-18 DIAGNOSIS — M5417 Radiculopathy, lumbosacral region: Secondary | ICD-10-CM

## 2016-11-18 NOTE — Therapy (Signed)
[facility] [address] [place], [address] Phone: [phone]   Fax:  [phone]  Physical Therapy Treatment  Patient Details  Name: Suzanne Faulkner MRN: [id number] Date of Birth: 1941-05-04 Referring Provider: Kirstie Peri. Caryn Section, MD  Encounter Date: 11/18/2016      PT End of Session - 11/18/16 1348    Visit Number 2   Number of Visits 13   Date for PT Re-Evaluation 12/25/16   Authorization Type 2   Authorization Time Period of 10 g code   PT Start Time 6948   PT Stop Time 1429   PT Time Calculation (min) 41 min   Activity Tolerance Patient tolerated treatment well   Behavior During Therapy WFL for tasks assessed/performed      Past Medical History:  Diagnosis Date  . Arthritis   . Colon polyps 2009  . Diabetes mellitus without complication ([place])   . History of kidney stones   . Hypertension   . Nocturia     Past Surgical History:  Procedure Laterality Date  . ABDOMINAL HYSTERECTOMY    . CHOLECYSTECTOMY    . goiter removal    . JOINT REPLACEMENT Left    knee  . SPINAL CORD STIMULATOR INSERTION N/A 03/21/2016   Procedure: LUMBAR SPINAL CORD STIMULATOR INSERTION;  Surgeon: Clydell Hakim, MD;  Location: [facility];  Service: Neurosurgery;  Laterality: N/A;  . TONSILLECTOMY      There were no vitals filed for this visit.      Subjective Assessment - 11/18/16 1349    Subjective Bilateral hip and posterior thigh are about the same. 0/10 sitting, 7/10 walking about 30 ft.    Pertinent History Low back pain with radiating symptoms. Feels pain both posterior hips and thighs. Has an electrical stimulator implant for her back (03/21/2016). Currently does not have back pain but feels symptoms at her hips and thighs. Has had pain for years. Pain worsened January of this year. Tried getting her stimulator reprogrammed which did not help. The stimulator used to help when she first got it but symptoms  eventually worsened. Denies bowel or bladder problems or saddle anesthesia.    Patient Stated Goals I want to walk better without pain.    Currently in Pain? Yes   Pain Score 7   when walking    Pain Onset More than a month ago                                 PT Education - 11/18/16 1407    Education provided Yes   Education Details ther-ex, HEP   Person(s) Educated Patient   Methods Explanation;Demonstration;Tactile cues;Verbal cues;Handout   Comprehension Verbalized understanding;Returned demonstration        Objectives  There-ex  Directed patient with seated pallof press straight resisting red band 10x2 with 5 second holds  L lower abdominal discomfort with scapular retracton, eases with rest. No symptoms without scapular retraction  Seated hip adduction ball squeeze with glute max squeeze 10x2 with 5 second holds.   Supine hip extension isometrics with leg straight, opposite LE in hooklying 10x 5 seconds for 2 sets each LE  Reviewed and given as part of her HEP instead of the prone glute max set. Pt demonstrated and verbalized understanding.   S/L clam shells 5x, then 4x and 3x for L LE. Weak. PT assist needed   S/L clam shells  5x, 4x, 3x for R LE. Weak. PT assist needed.    No bilateral hip pain with standing and walking with SPC after performing aforementioned exercises  Standing glute max squeeze 4x5 seconds. Slight increase in symptoms   Seated glute max squeeze 5x10 seconds. No increase in symptoms  Then 10x5 seconds       Improved exercise technique, movement at target joints, use of target muscles after mod verbal, visual, tactile cues.           PT Long Term Goals - 11/12/16 1838      PT LONG TERM GOAL #1   Title Patient will have a decrease in bilateral posterior hip and thigh pain to 4/10 or less at worst to promote ability to ambulate longer distances.    Baseline 9/10 pain at worst (11/12/2016)   Time 6   Period  Weeks   Status New     PT LONG TERM GOAL #2   Title Patient will improve bilateral glute max and med strength by at least 1/2 MMT to promote ability to ambulate with less posterior hip and thigh pain.    Time 6   Period Weeks   Status New     PT LONG TERM GOAL #3   Title Patient will report being able to ambulate at least 20 steps with [facility] with 4/10 bilateral posterior hip and thigh pain to promote mobility.   Baseline 9/10 pain when walking 6-7 steps (11/12/2016)   Time 6   Period Weeks   Status New               Plan - 11/18/16 1408    Clinical Impression Statement Pt demonstrates bilateral hip abductors and extensor weakness. Decreased bilateral posterior hip pain with gait following strengthening of hip muscles in supine and S/L positions.    Rehab Potential Fair   Clinical Impairments Affecting Rehab Potential (-) chronicity of condition; (+) motivated   PT Frequency 2x / week   PT Duration 6 weeks   PT Treatment/Interventions Gait training;Therapeutic activities;Therapeutic exercise;Neuromuscular re-education;Patient/family education;Manual techniques   PT Next Visit Plan glute max, med, trunk strengthening, gait   Consulted and Agree with Plan of Care Patient      Patient will benefit from skilled therapeutic intervention in order to improve the following deficits and impairments:  Pain, Postural dysfunction, Improper body mechanics, Difficulty walking, Decreased strength  Visit Diagnosis: Chronic low back pain with left-sided sciatica, unspecified back pain laterality  Chronic low back pain with right-sided sciatica, unspecified back pain laterality  Radiculopathy, lumbosacral region     Problem List Patient Active Problem List   Diagnosis Date Noted  . Lumbar spinal stenosis 10/23/2016  . Low back pain radiating to left leg 10/22/2016  . Thyroid nodule 07/01/2016  . Sternoclavicular joint pain, right 06/02/2016  . Chronic back pain 05/02/2016  .  Hyperlipidemia 04/04/2015  . Personal history of colonic polyps 02/02/2013  . TIA (transient ischemic attack) 01/25/2013  . HTN (hypertension) 01/25/2013  . Tobacco abuse, in remission 01/25/2013  . DM type 2 (diabetes mellitus, type 2) ([place]) 01/25/2013  . Bradycardia 01/25/2013   Joneen Boers PT, DPT   11/18/2016, 3:33 PM  [facility] [address] [place], [address] Phone: [phone]   Fax:  [phone]  Name: Suzanne Faulkner MRN: [id number] Date of Birth: Dec 12, 1940

## 2016-11-18 NOTE — Patient Instructions (Addendum)
     Adduction: Hip - Knees Together (Sitting)   Sit with two folded pillows between knees. Push knees together and squeeze your rear end muscles.  Hold for _5__ seconds. Repeat _10__ times. Do __2_ times a day.  Copyright  VHI. All rights reserved.      Gave supine hip extension isometrics with leg straight and opposite LE in hooklying 10x2 with 5 seconds as part of her HEP daily instead of doing the prone glute max/quad set HEP. Pt demonstrated and verbalized understanding. Handout provided.

## 2016-11-20 ENCOUNTER — Ambulatory Visit: Payer: Medicare Other

## 2016-11-20 DIAGNOSIS — M5442 Lumbago with sciatica, left side: Secondary | ICD-10-CM | POA: Diagnosis not present

## 2016-11-20 DIAGNOSIS — G8929 Other chronic pain: Secondary | ICD-10-CM | POA: Diagnosis not present

## 2016-11-20 DIAGNOSIS — M5441 Lumbago with sciatica, right side: Secondary | ICD-10-CM

## 2016-11-20 DIAGNOSIS — M5417 Radiculopathy, lumbosacral region: Secondary | ICD-10-CM | POA: Diagnosis not present

## 2016-11-20 DIAGNOSIS — E113393 Type 2 diabetes mellitus with moderate nonproliferative diabetic retinopathy without macular edema, bilateral: Secondary | ICD-10-CM | POA: Diagnosis not present

## 2016-11-20 NOTE — Therapy (Signed)
[place] [facility] [address], [place], [address] Phone: [phone]   Fax:  [phone]  Physical Therapy Treatment  Patient Details  Name: Suzanne Faulkner MRN: [id number] Date of Birth: 10/24/1940 Referring Provider: Kirstie Peri. Caryn Section, MD  Encounter Date: 11/20/2016      PT End of Session - 11/20/16 0817    Visit Number 3   Number of Visits 13   Date for PT Re-Evaluation 12/25/16   Authorization Type 3   Authorization Time Period of 10 g code   PT Start Time 0819   PT Stop Time 0900   PT Time Calculation (min) 41 min   Activity Tolerance Patient tolerated treatment well   Behavior During Therapy [facility] for tasks assessed/performed      Past Medical History:  Diagnosis Date  . Arthritis   . Colon polyps 2009  . Diabetes mellitus without complication ([place])   . History of kidney stones   . Hypertension   . Nocturia     Past Surgical History:  Procedure Laterality Date  . ABDOMINAL HYSTERECTOMY    . CHOLECYSTECTOMY    . goiter removal    . JOINT REPLACEMENT Left    knee  . SPINAL CORD STIMULATOR INSERTION N/A 03/21/2016   Procedure: LUMBAR SPINAL CORD STIMULATOR INSERTION;  Surgeon: Clydell Hakim, MD;  Location: [place] [facility];  Service: Neurosurgery;  Laterality: N/A;  . TONSILLECTOMY      There were no vitals filed for this visit.      Subjective Assessment - 11/20/16 0819    Subjective Pt states that her hips and thigh are about the same. 5-6/10 when walking. Having a pretty good day today so far.    Pertinent History Low back pain with radiating symptoms. Feels pain both posterior hips and thighs. Has an electrical stimulator implant for her back (03/21/2016). Currently does not have back pain but feels symptoms at her hips and thighs. Has had pain for years. Pain worsened January of this year. Tried getting her stimulator reprogrammed which did not help. The stimulator used to help when she first got it  but symptoms eventually worsened. Denies bowel or bladder problems or saddle anesthesia.    Patient Stated Goals I want to walk better without pain.    Currently in Pain? Yes   Pain Score 6    Pain Onset More than a month ago                                 PT Education - 11/20/16 0174    Education provided Yes   Education Details ther-ex   Person(s) Educated Patient   Methods Explanation;Tactile cues;Verbal cues;Demonstration   Comprehension Verbalized understanding;Returned demonstration        Objectives  There-ex    Supine hip extension isometrics with leg straight, opposite LE in hooklying 10x 5 seconds for 2 sets each LE  S/L clam shells 5x, then 4x and 3x for L LE. PT assist needed    S/L clam shells 5x, 4x, 3x for R LE. PT assist needed.   Seated glute max squeeze 5x10 seconds. No increase in symptoms             Then 10x5 seconds   Seated manually resisted hip extension (gentle) 3x each LE to promote glute max strengthening.  R lateral thigh discomfort which eases with rest.    Seated hip adduction  ball squeeze with glute max squeeze 10x2 with 5 second holds.            Gait with SPC 60 ft with glute max squeeze during LE stance phase. No pain   Then 60 ft again. Slight bilateral posterior hip and thigh discomfort   Seated bilateral ankle DF/PF 10x each way. Limited R ankle DF compared to L   Seated bilateral ankle PF 10x  Gait x 40 ft with SPC. Symptoms with gait.   Seated hip adduction ball squeeze with glute max squeeze again 5x5 seconds   Improved exercise technique, movement at target joints, use of target muscles after min to mod verbal, visual, tactile cues.     No pain with gait with SPC first 60 ft following seated, supine and S/L exercises to promote hip strengthening, which is significantly longer than the original onset of symptoms with just her first step prior to PT eval. Symptoms returned with increased  walking.                PT Long Term Goals - 11/12/16 1838      PT LONG TERM GOAL #1   Title Patient will have a decrease in bilateral posterior hip and thigh pain to 4/10 or less at worst to promote ability to ambulate longer distances.    Baseline 9/10 pain at worst (11/12/2016)   Time 6   Period Weeks   Status New     PT LONG TERM GOAL #2   Title Patient will improve bilateral glute max and med strength by at least 1/2 MMT to promote ability to ambulate with less posterior hip and thigh pain.    Time 6   Period Weeks   Status New     PT LONG TERM GOAL #3   Title Patient will report being able to ambulate at least 20 steps with [facility] with 4/10 bilateral posterior hip and thigh pain to promote mobility.   Baseline 9/10 pain when walking 6-7 steps (11/12/2016)   Time 6   Period Weeks   Status New               Plan - 11/20/16 7001    Clinical Impression Statement No pain with gait with SPC first 60 ft following seated, supine and S/L exercises to promote hip strengthening, which is significantly longer than the original onset of symptoms with just her first step prior to PT eval. Symptoms returned with increased walking.    Rehab Potential Fair   Clinical Impairments Affecting Rehab Potential (-) chronicity of condition; (+) motivated   PT Frequency 2x / week   PT Duration 6 weeks   PT Treatment/Interventions Gait training;Therapeutic activities;Therapeutic exercise;Neuromuscular re-education;Patient/family education;Manual techniques   PT Next Visit Plan glute max, med, trunk strengthening, gait   Consulted and Agree with Plan of Care Patient      Patient will benefit from skilled therapeutic intervention in order to improve the following deficits and impairments:  Pain, Postural dysfunction, Improper body mechanics, Difficulty walking, Decreased strength  Visit Diagnosis: Chronic low back pain with left-sided sciatica, unspecified back pain  laterality  Chronic low back pain with right-sided sciatica, unspecified back pain laterality  Radiculopathy, lumbosacral region     Problem List Patient Active Problem List   Diagnosis Date Noted  . Lumbar spinal stenosis 10/23/2016  . Low back pain radiating to left leg 10/22/2016  . Thyroid nodule 07/01/2016  . Sternoclavicular joint pain, right 06/02/2016  . Chronic back pain 05/02/2016  .  Hyperlipidemia 04/04/2015  . Personal history of colonic polyps 02/02/2013  . TIA (transient ischemic attack) 01/25/2013  . HTN (hypertension) 01/25/2013  . Tobacco abuse, in remission 01/25/2013  . DM type 2 (diabetes mellitus, type 2) ([place]) 01/25/2013  . Bradycardia 01/25/2013   Joneen Boers PT, DPT   11/20/2016, 3:50 PM  [place] [facility] [address] [place], [address] Phone: [phone]   Fax:  [phone]  Name: Suzanne Faulkner MRN: [id number] Date of Birth: Jul 08, 1941

## 2016-11-25 ENCOUNTER — Ambulatory Visit: Payer: Medicare Other

## 2016-11-25 DIAGNOSIS — M5442 Lumbago with sciatica, left side: Principal | ICD-10-CM

## 2016-11-25 DIAGNOSIS — M5441 Lumbago with sciatica, right side: Secondary | ICD-10-CM | POA: Diagnosis not present

## 2016-11-25 DIAGNOSIS — G8929 Other chronic pain: Secondary | ICD-10-CM

## 2016-11-25 DIAGNOSIS — M5417 Radiculopathy, lumbosacral region: Secondary | ICD-10-CM | POA: Diagnosis not present

## 2016-11-25 NOTE — Therapy (Signed)
[place] [facility] [address] [place], [address] Phone: [phone]   Fax:  [phone]  Physical Therapy Treatment  Patient Details  Name: Suzanne Faulkner MRN: [id number] Date of Birth: 06/16/1941 Referring Provider: Kirstie Peri. Caryn Section, MD  Encounter Date: 11/25/2016      PT End of Session - 11/25/16 0901    Visit Number 4   Number of Visits 13   Date for PT Re-Evaluation 12/25/16   Authorization Type 4   Authorization Time Period of 10 g code   PT Start Time 0901   PT Stop Time 0941   PT Time Calculation (min) 40 min   Activity Tolerance Patient tolerated treatment well   Behavior During Therapy [facility] for tasks assessed/performed      Past Medical History:  Diagnosis Date  . Arthritis   . Colon polyps 2009  . Diabetes mellitus without complication ([place])   . History of kidney stones   . Hypertension   . Nocturia     Past Surgical History:  Procedure Laterality Date  . ABDOMINAL HYSTERECTOMY    . CHOLECYSTECTOMY    . goiter removal    . JOINT REPLACEMENT Left    knee  . SPINAL CORD STIMULATOR INSERTION N/A 03/21/2016   Procedure: LUMBAR SPINAL CORD STIMULATOR INSERTION;  Surgeon: Clydell Hakim, MD;  Location: [place] [facility];  Service: Neurosurgery;  Laterality: N/A;  . TONSILLECTOMY      There were no vitals filed for this visit.      Subjective Assessment - 11/25/16 0903    Subjective The hips and thighs feel pretty good this morning. No pain with walking from waiting room to treatment room.    Pertinent History Low back pain with radiating symptoms. Feels pain both posterior hips and thighs. Has an electrical stimulator implant for her back (03/21/2016). Currently does not have back pain but feels symptoms at her hips and thighs. Has had pain for years. Pain worsened January of this year. Tried getting her stimulator reprogrammed which did not help. The stimulator used to help when she first got it but  symptoms eventually worsened. Denies bowel or bladder problems or saddle anesthesia.    Patient Stated Goals I want to walk better without pain.    Currently in Pain? No/denies   Pain Score 0-No pain   Pain Onset More than a month ago                                 PT Education - 11/25/16 0914    Education provided Yes   Education Details ther-ex   [employer]) Educated Patient   Methods Explanation;Demonstration;Tactile cues;Verbal cues   Comprehension Verbalized understanding;Returned demonstration        Objectives  There-ex  seated physioball rolls ffor knee flexion/extension, emphasis on hip control   L LE 10x3   R LE 10x3  Supine hip extension isometrics with leg straight, opposite LE in hooklying 10x 5 seconds for 1 sets each LE  S/L clam shells 5x, then 4x and3x for L LE. Decreasing PT assist   S/L clam shells 5x, 4x, 3xfor R LE. No PT assist.   Seated glute max squeeze 5x10 seconds. No increase in symptoms Then 10x5 seconds  Standing gentle lateral weight shifting with bilateral UE assist 1x5 seconds to the R  Increased symptoms, disappears with rest  Standing with bilateral UE assist:   Alternating  heel lifts 10x2 each LE. Demonstrates R lateral lean during R LE stance   Seated bilateral ankle DF/PF 10x each way. Limited R ankle DF compared to L              Seated bilateral ankle PF 10x  Seated gentle manually resisted clam shell isometrics 10x5 seconds, hips less than 90 degrees flexion  Seated hip adduction ball squeeze with glute max squeeze 10x with 5 second holds.   Improved exercise technique, movement at target joints, use of target muscles after min to mod verbal, visual, tactile cues.    No pain after session. Good carry over of decreased pain from last session. Continue working on gentle hip strengthening as tolerated.          PT Long Term Goals - 11/12/16 1838      PT LONG TERM GOAL #1    Title Patient will have a decrease in bilateral posterior hip and thigh pain to 4/10 or less at worst to promote ability to ambulate longer distances.    Baseline 9/10 pain at worst (11/12/2016)   Time 6   Period Weeks   Status New     PT LONG TERM GOAL #2   Title Patient will improve bilateral glute max and med strength by at least 1/2 MMT to promote ability to ambulate with less posterior hip and thigh pain.    Time 6   Period Weeks   Status New     PT LONG TERM GOAL #3   Title Patient will report being able to ambulate at least 20 steps with [facility] with 4/10 bilateral posterior hip and thigh pain to promote mobility.   Baseline 9/10 pain when walking 6-7 steps (11/12/2016)   Time 6   Period Weeks   Status New               Plan - 11/25/16 0914    Clinical Impression Statement No pain after session. Good carry over of decreased pain from last session. Continue working on gentle hip strengthening as tolerated.    Rehab Potential Fair   Clinical Impairments Affecting Rehab Potential (-) chronicity of condition; (+) motivated   PT Frequency 2x / week   PT Duration 6 weeks   PT Treatment/Interventions Gait training;Therapeutic activities;Therapeutic exercise;Neuromuscular re-education;Patient/family education;Manual techniques   PT Next Visit Plan glute max, med, trunk strengthening, gait   Consulted and Agree with Plan of Care Patient      Patient will benefit from skilled therapeutic intervention in order to improve the following deficits and impairments:  Pain, Postural dysfunction, Improper body mechanics, Difficulty walking, Decreased strength  Visit Diagnosis: Chronic low back pain with left-sided sciatica, unspecified back pain laterality  Chronic low back pain with right-sided sciatica, unspecified back pain laterality  Radiculopathy, lumbosacral region     Problem List Patient Active Problem List   Diagnosis Date Noted  . Lumbar spinal stenosis 10/23/2016  .  Low back pain radiating to left leg 10/22/2016  . Thyroid nodule 07/01/2016  . Sternoclavicular joint pain, right 06/02/2016  . Chronic back pain 05/02/2016  . Hyperlipidemia 04/04/2015  . Personal history of colonic polyps 02/02/2013  . TIA (transient ischemic attack) 01/25/2013  . HTN (hypertension) 01/25/2013  . Tobacco abuse, in remission 01/25/2013  . DM type 2 (diabetes mellitus, type 2) ([place]) 01/25/2013  . Bradycardia 01/25/2013   Joneen Boers PT, DPT   11/25/2016, 7:26 PM  [place] [facility] [address], [place], [address] Phone: [phone]   Fax:  [phone]  Name: Suzanne Faulkner MRN: [id number] Date of Birth: [birth year]

## 2016-12-01 ENCOUNTER — Ambulatory Visit: Payer: Medicare Other

## 2016-12-01 DIAGNOSIS — G8929 Other chronic pain: Secondary | ICD-10-CM | POA: Diagnosis not present

## 2016-12-01 DIAGNOSIS — M5441 Lumbago with sciatica, right side: Secondary | ICD-10-CM

## 2016-12-01 DIAGNOSIS — M5442 Lumbago with sciatica, left side: Principal | ICD-10-CM

## 2016-12-01 DIAGNOSIS — M5417 Radiculopathy, lumbosacral region: Secondary | ICD-10-CM | POA: Diagnosis not present

## 2016-12-01 NOTE — Therapy (Signed)
[place] [facility] [address] [place], [address] Phone: [phone]   Fax:  [phone]  Physical Therapy Treatment  Patient Details  Name: Suzanne Faulkner MRN: [id number] Date of Birth: 1941-01-14 Referring Provider: Kirstie Peri. Caryn Section, MD  Encounter Date: 12/01/2016      PT End of Session - 12/01/16 1511    Visit Number 5   Number of Visits 13   Date for PT Re-Evaluation 12/25/16   Authorization Type 5   Authorization Time Period of 10 g code   PT Start Time 8341   PT Stop Time 1546   PT Time Calculation (min) 32 min   Activity Tolerance Patient tolerated treatment well   Behavior During Therapy [facility] for tasks assessed/performed      Past Medical History:  Diagnosis Date  . Arthritis   . Colon polyps 2009  . Diabetes mellitus without complication ([place])   . History of kidney stones   . Hypertension   . Nocturia     Past Surgical History:  Procedure Laterality Date  . ABDOMINAL HYSTERECTOMY    . CHOLECYSTECTOMY    . goiter removal    . JOINT REPLACEMENT Left    knee  . SPINAL CORD STIMULATOR INSERTION N/A 03/21/2016   Procedure: LUMBAR SPINAL CORD STIMULATOR INSERTION;  Surgeon: Clydell Hakim, MD;  Location: [place] [facility];  Service: Neurosurgery;  Laterality: N/A;  . TONSILLECTOMY      There were no vitals filed for this visit.      Subjective Assessment - 12/01/16 1514    Subjective 4/10 bilateral hip and posterior thigh pain when walking. Doing pretty good today. Got pills to help with her leg and thigh cramps last Thursday.  Did her HEP on Saturday which helped make her hips and thighs feel better.    Pertinent History Low back pain with radiating symptoms. Feels pain both posterior hips and thighs. Has an electrical stimulator implant for her back (03/21/2016). Currently does not have back pain but feels symptoms at her hips and thighs. Has had pain for years. Pain worsened January of this year.  Tried getting her stimulator reprogrammed which did not help. The stimulator used to help when she first got it but symptoms eventually worsened. Denies bowel or bladder problems or saddle anesthesia.    Patient Stated Goals I want to walk better without pain.    Currently in Pain? Yes   Pain Score 4    Pain Onset More than a month ago                                 PT Education - 12/01/16 1524    Education provided Yes   Education Details ther-ex   [employer]) Educated Patient   Methods Explanation;Demonstration;Tactile cues;Verbal cues   Comprehension Verbalized understanding;Returned demonstration        Objectives  There-ex  Seated bilateral ankle DF/PF 10x2  seated physioball rolls ffor knee flexion/extension, emphasis on hip control              L LE 10x3              R LE 10x3  Improving bilateral hip control  Seated gentle manually resisted clam shell isometrics 10x5 seconds for 2 sets, hips less than 90 degrees flexion  Seated hip adduction ball squeeze with glute max squeeze 10x with 5 second holds.   S/L clam  shells 5x2 for L LE. No PT assist    S/L clam shells 5x2 for R LE. No PT assist.     Standing with bilateral UE assist:              Alternating heel lifts 10x2 each LE. Demonstrates R lateral lean during R LE stance and contralateral pelvic drop bilaterally  Gait x 80 ft with SPC. Bilateral LE symptoms. Ipsilateral lean during stance phase.   Side stepping with bilateral UE assist 5 ft to each side, then 3 ft to the R. Pt states R posterior thigh locking up after walking to the R a second time. Eases with seated rest.    Seated glute max squeeze 5x10 seconds. No pain with gait with SPC a few feet.   Improved exercise technique, movement at target joints, use of target muscles after min to mod verbal, visual, tactile cues.    No bilateral hip pain after session when walking a few feet with her SPC. Pt demonstrates  decreased lumbopelvic control during gait, with ipsilateral trunk lean with contralateral pelvic drop during stance phase.       PT Long Term Goals - 11/12/16 1838      PT LONG TERM GOAL #1   Title Patient will have a decrease in bilateral posterior hip and thigh pain to 4/10 or less at worst to promote ability to ambulate longer distances.    Baseline 9/10 pain at worst (11/12/2016)   Time 6   Period Weeks   Status New     PT LONG TERM GOAL #2   Title Patient will improve bilateral glute max and med strength by at least 1/2 MMT to promote ability to ambulate with less posterior hip and thigh pain.    Time 6   Period Weeks   Status New     PT LONG TERM GOAL #3   Title Patient will report being able to ambulate at least 20 steps with [facility] with 4/10 bilateral posterior hip and thigh pain to promote mobility.   Baseline 9/10 pain when walking 6-7 steps (11/12/2016)   Time 6   Period Weeks   Status New               Plan - 12/01/16 1511    Clinical Impression Statement No bilateral hip pain after session when walking a few feet with her SPC. Pt demonstrates decreased lumbopelvic control during gait, with ipsilateral trunk lean with contralateral pelvic drop during stance phase.   Rehab Potential Fair   Clinical Impairments Affecting Rehab Potential (-) chronicity of condition; (+) motivated   PT Frequency 2x / week   PT Duration 6 weeks   PT Treatment/Interventions Gait training;Therapeutic activities;Therapeutic exercise;Neuromuscular re-education;Patient/family education;Manual techniques   PT Next Visit Plan glute max, med, trunk strengthening, gait   Consulted and Agree with Plan of Care Patient      Patient will benefit from skilled therapeutic intervention in order to improve the following deficits and impairments:  Pain, Postural dysfunction, Improper body mechanics, Difficulty walking, Decreased strength  Visit Diagnosis: Chronic low back pain with left-sided  sciatica, unspecified back pain laterality  Chronic low back pain with right-sided sciatica, unspecified back pain laterality  Radiculopathy, lumbosacral region     Problem List Patient Active Problem List   Diagnosis Date Noted  . Lumbar spinal stenosis 10/23/2016  . Low back pain radiating to left leg 10/22/2016  . Thyroid nodule 07/01/2016  . Sternoclavicular joint pain, right 06/02/2016  . Chronic  back pain 05/02/2016  . Hyperlipidemia 04/04/2015  . Personal history of colonic polyps 02/02/2013  . TIA (transient ischemic attack) 01/25/2013  . HTN (hypertension) 01/25/2013  . Tobacco abuse, in remission 01/25/2013  . DM type 2 (diabetes mellitus, type 2) ([place]) 01/25/2013  . Bradycardia 01/25/2013    Joneen Boers PT, DPT   12/01/2016, 4:02 PM  [place] [facility] [address] [place], [address] Phone: [phone]   Fax:  [phone]  Name: Suzanne Faulkner MRN: [id number] Date of Birth: October 21, 1940

## 2016-12-03 ENCOUNTER — Ambulatory Visit: Payer: Medicare Other

## 2016-12-03 DIAGNOSIS — G8929 Other chronic pain: Secondary | ICD-10-CM

## 2016-12-03 DIAGNOSIS — M5441 Lumbago with sciatica, right side: Secondary | ICD-10-CM | POA: Diagnosis not present

## 2016-12-03 DIAGNOSIS — M5442 Lumbago with sciatica, left side: Principal | ICD-10-CM

## 2016-12-03 DIAGNOSIS — M5417 Radiculopathy, lumbosacral region: Secondary | ICD-10-CM | POA: Diagnosis not present

## 2016-12-03 NOTE — Therapy (Signed)
[facility] [address] [place], [address] Phone: [phone]   Fax:  [phone]  Physical Therapy Treatment  Patient Details  Name: Suzanne Faulkner MRN: [id number] Date of Birth: [birth year] Referring Provider: Kirstie Peri. Caryn Section, MD  Encounter Date: 12/03/2016      PT End of Session - 12/03/16 1430    Visit Number 6   Number of Visits 13   Date for PT Re-Evaluation 12/25/16   Authorization Type 6   Authorization Time Period of 10 g code   PT Start Time 1430   PT Stop Time 1501   PT Time Calculation (min) 31 min   Activity Tolerance Patient tolerated treatment well   Behavior During Therapy WFL for tasks assessed/performed      Past Medical History:  Diagnosis Date  . Arthritis   . Colon polyps 2009  . Diabetes mellitus without complication ([place])   . History of kidney stones   . Hypertension   . Nocturia     Past Surgical History:  Procedure Laterality Date  . ABDOMINAL HYSTERECTOMY    . CHOLECYSTECTOMY    . goiter removal    . JOINT REPLACEMENT Left    knee  . SPINAL CORD STIMULATOR INSERTION N/A 03/21/2016   Procedure: LUMBAR SPINAL CORD STIMULATOR INSERTION;  Surgeon: Clydell Hakim, MD;  Location: [facility];  Service: Neurosurgery;  Laterality: N/A;  . TONSILLECTOMY      There were no vitals filed for this visit.      Subjective Assessment - 12/03/16 1431    Subjective No pain or discomfort bilateral posterior hip and thigh when walking from waiting room to treatment room. Surgical incision itches.  Pt states not feeling too good. Feels hot which is unusual today (pt mentions this after performing some exercises).  No fever.    Pertinent History Low back pain with radiating symptoms. Feels pain both posterior hips and thighs. Has an electrical stimulator implant for her back (03/21/2016). Currently does not have back pain but feels symptoms at her hips and thighs. Has had pain for years.  Pain worsened January of this year. Tried getting her stimulator reprogrammed which did not help. The stimulator used to help when she first got it but symptoms eventually worsened. Denies bowel or bladder problems or saddle anesthesia.    Patient Stated Goals I want to walk better without pain.    Currently in Pain? No/denies   Pain Score 0-No pain   Pain Onset More than a month ago                                 PT Education - 12/03/16 1434    Education provided Yes   Education Details ther-ex   [employer]) Educated Patient   Methods Explanation;Demonstration;Tactile cues;Verbal cues   Comprehension Returned demonstration;Verbalized understanding        Objectives  There-ex   seated physioball rolls ffor knee flexion/extension, emphasis on hip control  L LE 10x3  R LE 10x3             Improving bilateral hip control   Blood pressure L arm sitting, mechanically taken (secondary to pt stating not feeling too good, and feels hot which is unusual for her) 128/82, HR 67  Temperature: 98.4  Seated gentle manually resisted clam shell isometrics 10x5 seconds for 2 sets, hips less than 90 degrees flexion   Seated  hip adduction ball squeeze with glute max squeeze 10x with 5 second holds.   Seated bilateral ankle DF/PF 10x2   Standing with bilateral UE assist:  Alternating heel lifts 10x each LE.     Seated glute max squeeze 5x10 seconds.     Improved exercise technique, movement at target joints, use of target muscles after min to mod verbal, visual, tactile cues.    Good carry over of decreased symptoms with gait from previous sessions. Light exercises performed secondary to pt stating not feeling good today. Continued with gentle hip strengthening.            PT Long Term Goals - 11/12/16 1838      PT LONG TERM GOAL #1   Title Patient will have a decrease in bilateral posterior hip and thigh  pain to 4/10 or less at worst to promote ability to ambulate longer distances.    Baseline 9/10 pain at worst (11/12/2016)   Time 6   Period Weeks   Status New     PT LONG TERM GOAL #2   Title Patient will improve bilateral glute max and med strength by at least 1/2 MMT to promote ability to ambulate with less posterior hip and thigh pain.    Time 6   Period Weeks   Status New     PT LONG TERM GOAL #3   Title Patient will report being able to ambulate at least 20 steps with [facility] with 4/10 bilateral posterior hip and thigh pain to promote mobility.   Baseline 9/10 pain when walking 6-7 steps (11/12/2016)   Time 6   Period Weeks   Status New               Plan - 12/03/16 1435    Clinical Impression Statement Good carry over of decreased symptoms with gait from previous sessions. Light exercises performed secondary to pt stating not feeling good today. Continued with gentle hip strengthening.    Rehab Potential Fair   Clinical Impairments Affecting Rehab Potential (-) chronicity of condition; (+) motivated   PT Frequency 2x / week   PT Duration 6 weeks   PT Treatment/Interventions Gait training;Therapeutic activities;Therapeutic exercise;Neuromuscular re-education;Patient/family education;Manual techniques   PT Next Visit Plan glute max, med, trunk strengthening, gait   Consulted and Agree with Plan of Care Patient      Patient will benefit from skilled therapeutic intervention in order to improve the following deficits and impairments:  Pain, Postural dysfunction, Improper body mechanics, Difficulty walking, Decreased strength  Visit Diagnosis: Chronic low back pain with left-sided sciatica, unspecified back pain laterality  Chronic low back pain with right-sided sciatica, unspecified back pain laterality  Radiculopathy, lumbosacral region     Problem List Patient Active Problem List   Diagnosis Date Noted  . Lumbar spinal stenosis 10/23/2016  . Low back pain  radiating to left leg 10/22/2016  . Thyroid nodule 07/01/2016  . Sternoclavicular joint pain, right 06/02/2016  . Chronic back pain 05/02/2016  . Hyperlipidemia 04/04/2015  . Personal history of colonic polyps 02/02/2013  . TIA (transient ischemic attack) 01/25/2013  . HTN (hypertension) 01/25/2013  . Tobacco abuse, in remission 01/25/2013  . DM type 2 (diabetes mellitus, type 2) ([place]) 01/25/2013  . Bradycardia 01/25/2013   Joneen Boers PT, DPT   12/04/2016, 10:26 AM  [facility] [address], [place], [address] Phone: [phone]   Fax:  [phone]  Name: Suzanne Faulkner MRN: [id number]  Date of Birth: [birth year]

## 2016-12-08 ENCOUNTER — Ambulatory Visit: Payer: Medicare Other

## 2016-12-08 DIAGNOSIS — M5442 Lumbago with sciatica, left side: Principal | ICD-10-CM

## 2016-12-08 DIAGNOSIS — M5417 Radiculopathy, lumbosacral region: Secondary | ICD-10-CM | POA: Diagnosis not present

## 2016-12-08 DIAGNOSIS — G8929 Other chronic pain: Secondary | ICD-10-CM

## 2016-12-08 DIAGNOSIS — M5441 Lumbago with sciatica, right side: Secondary | ICD-10-CM | POA: Diagnosis not present

## 2016-12-08 NOTE — Therapy (Signed)
[facility] [address], [place], [address] Phone: [phone]   Fax:  [phone]  Physical Therapy Treatment  Patient Details  Name: Suzanne Faulkner MRN: [id number] Date of Birth: [birth year] Referring Provider: Kirstie Peri. Caryn Section, MD  Encounter Date: 12/08/2016      PT End of Session - 12/08/16 0907    Visit Number 7   Number of Visits 13   Date for PT Re-Evaluation 12/25/16   Authorization Type 7   Authorization Time Period of 10 g code   PT Start Time 0908   PT Stop Time 0941   PT Time Calculation (min) 33 min   Activity Tolerance Patient tolerated treatment well   Behavior During Therapy [facility] for tasks assessed/performed      Past Medical History:  Diagnosis Date  . Arthritis   . Colon polyps 2009  . Diabetes mellitus without complication ([place])   . History of kidney stones   . Hypertension   . Nocturia     Past Surgical History:  Procedure Laterality Date  . ABDOMINAL HYSTERECTOMY    . CHOLECYSTECTOMY    . goiter removal    . JOINT REPLACEMENT Left    knee  . SPINAL CORD STIMULATOR INSERTION N/A 03/21/2016   Procedure: LUMBAR SPINAL CORD STIMULATOR INSERTION;  Surgeon: Clydell Hakim, MD;  Location: [facility];  Service: Neurosurgery;  Laterality: N/A;  . TONSILLECTOMY      There were no vitals filed for this visit.      Subjective Assessment - 12/08/16 0910    Subjective Pt states thats she figured out that her hot flashes are coming back, that's why she was feeling warm last time.  Pt states that she does not have pain. Walked in without using her cane this morning.  6/10 bilateral hip and posterior thigh pain at most for the past 7 days.    Pertinent History Low back pain with radiating symptoms. Feels pain both posterior hips and thighs. Has an electrical stimulator implant for her back (03/21/2016). Currently does not have back pain but feels symptoms at her hips and thighs. Has had pain  for years. Pain worsened January of this year. Tried getting her stimulator reprogrammed which did not help. The stimulator used to help when she first got it but symptoms eventually worsened. Denies bowel or bladder problems or saddle anesthesia.    Patient Stated Goals I want to walk better without pain.    Currently in Pain? No/denies   Pain Onset More than a month ago                                 PT Education - 12/08/16 0921    Education provided Yes   Education Details ther-ex, HEP   Person(s) Educated Patient   Methods Explanation;Demonstration;Tactile cues;Verbal cues   Comprehension Returned demonstration;Verbalized understanding        Objectives  There-ex  Seated bilateral ankle DF/PF 10x2  S/L clam shells 5x2 for L LE. No PT assist    S/L clam shells 5x2 for R LE. NoPT assist.   seated physioball rolls for knee flexion/extension, emphasis on hip control  R LE 10x3  L LE 10x3          Standing with bilateral UE assist:  Alternating heel lifts 10x2 each LE. Bilateral posterior hip and thigh symptoms, eases with seated rest.   Seated gentle  manually resisted clam shell isometrics 10x5 seconds for 3 sets, hips less than 90 degrees flexion  Seated hip adduction ball squeeze with glute max squeeze 10x with 5 second holds.   Gait 84 ft with SPC to no SPC assist 2/10 symptoms.    Improved exercise technique, movement at target joints, use of target muscles after min to mod verbal, visual, tactile cues.     Pt making very good progress towards goal of decreased bilateral hip and thigh pain with gait based on patient reports. Improving ability to activate glute med muscles with S/L clam shell and seated manually resisted clam shell isometric exercise. Improving thigh control with seated knee flexion/extension while rolling a small physioball. Able to ambulate 84 ft with with SPC to without SPC assist  with 2/10 pain today, potentially meeting one of her LTGs.              PT Long Term Goals - 11/12/16 1838      PT LONG TERM GOAL #1   Title Patient will have a decrease in bilateral posterior hip and thigh pain to 4/10 or less at worst to promote ability to ambulate longer distances.    Baseline 9/10 pain at worst (11/12/2016)   Time 6   Period Weeks   Status New     PT LONG TERM GOAL #2   Title Patient will improve bilateral glute max and med strength by at least 1/2 MMT to promote ability to ambulate with less posterior hip and thigh pain.    Time 6   Period Weeks   Status New     PT LONG TERM GOAL #3   Title Patient will report being able to ambulate at least 20 steps with [facility] with 4/10 bilateral posterior hip and thigh pain to promote mobility.   Baseline 9/10 pain when walking 6-7 steps (11/12/2016)   Time 6   Period Weeks   Status New               Plan - 12/08/16 0906    Clinical Impression Statement Pt making very good progress towards goal of decreased bilateral hip and thigh pain with gait based on patient reports. Improving ability to activate glute med muscles with S/L clam shell and seated manually resisted clam shell exercise. Improving thigh control with seated knee flexion/extension while rolling a small physioball. Able to ambulate 84 ft with with SPC to without SPC assist with 2/10 pain today, potentially meeting one of her LTGs.    Rehab Potential Fair   Clinical Impairments Affecting Rehab Potential (-) chronicity of condition; (+) motivated   PT Frequency 2x / week   PT Duration 6 weeks   PT Treatment/Interventions Gait training;Therapeutic activities;Therapeutic exercise;Neuromuscular re-education;Patient/family education;Manual techniques   PT Next Visit Plan glute max, med, trunk strengthening, gait   Consulted and Agree with Plan of Care Patient      Patient will benefit from skilled therapeutic intervention in order to improve the following  deficits and impairments:  Pain, Postural dysfunction, Improper body mechanics, Difficulty walking, Decreased strength  Visit Diagnosis: Chronic low back pain with left-sided sciatica, unspecified back pain laterality  Chronic low back pain with right-sided sciatica, unspecified back pain laterality  Radiculopathy, lumbosacral region     Problem List Patient Active Problem List   Diagnosis Date Noted  . Lumbar spinal stenosis 10/23/2016  . Low back pain radiating to left leg 10/22/2016  . Thyroid nodule 07/01/2016  . Sternoclavicular joint pain, right 06/02/2016  .  Chronic back pain 05/02/2016  . Hyperlipidemia 04/04/2015  . Personal history of colonic polyps 02/02/2013  . TIA (transient ischemic attack) 01/25/2013  . HTN (hypertension) 01/25/2013  . Tobacco abuse, in remission 01/25/2013  . DM type 2 (diabetes mellitus, type 2) ([place]) 01/25/2013  . Bradycardia 01/25/2013    Joneen Boers PT, DPT   12/08/2016, 12:54 PM  [facility] [address], [place], [address] Phone: [phone]   Fax:  [phone]   Name: Suzanne Faulkner MRN: [id number] Date of Birth: October 01, 1940

## 2016-12-08 NOTE — Patient Instructions (Signed)
Gave seated ball rolls for knee flexion and extension 10x3 each LE as part of her HEP. Pt demonstrated and verbalized understanding.

## 2016-12-10 ENCOUNTER — Ambulatory Visit: Payer: Medicare Other | Attending: Family Medicine

## 2016-12-10 DIAGNOSIS — M5441 Lumbago with sciatica, right side: Secondary | ICD-10-CM | POA: Insufficient documentation

## 2016-12-10 DIAGNOSIS — M5442 Lumbago with sciatica, left side: Secondary | ICD-10-CM | POA: Insufficient documentation

## 2016-12-10 DIAGNOSIS — M5417 Radiculopathy, lumbosacral region: Secondary | ICD-10-CM

## 2016-12-10 DIAGNOSIS — G8929 Other chronic pain: Secondary | ICD-10-CM | POA: Diagnosis not present

## 2016-12-10 NOTE — Therapy (Signed)
[facility] [address] [place], [address] Phone: [phone]   Fax:  [phone]  Physical Therapy Treatment  Patient Details  Name: Suzanne Faulkner MRN: [id number] Date of Birth: 15-Sep-1940 Referring Provider: Kirstie Peri. Caryn Section, MD  Encounter Date: 12/10/2016      PT End of Session - 12/10/16 0916    Visit Number 8   Number of Visits 13   Date for PT Re-Evaluation 12/25/16   Authorization Type 8   Authorization Time Period of 10 g code   PT Start Time 0917   PT Stop Time 0959   PT Time Calculation (min) 42 min   Activity Tolerance Patient tolerated treatment well   Behavior During Therapy [facility] for tasks assessed/performed      Past Medical History:  Diagnosis Date  . Arthritis   . Colon polyps 2009  . Diabetes mellitus without complication ([place])   . History of kidney stones   . Hypertension   . Nocturia     Past Surgical History:  Procedure Laterality Date  . ABDOMINAL HYSTERECTOMY    . CHOLECYSTECTOMY    . goiter removal    . JOINT REPLACEMENT Left    knee  . SPINAL CORD STIMULATOR INSERTION N/A 03/21/2016   Procedure: LUMBAR SPINAL CORD STIMULATOR INSERTION;  Surgeon: Clydell Hakim, MD;  Location: [facility];  Service: Neurosurgery;  Laterality: N/A;  . TONSILLECTOMY      There were no vitals filed for this visit.      Subjective Assessment - 12/10/16 0918    Subjective The hips and thigh are ok today. 3/10 when walking.    Pertinent History Low back pain with radiating symptoms. Feels pain both posterior hips and thighs. Has an electrical stimulator implant for her back (03/21/2016). Currently does not have back pain but feels symptoms at her hips and thighs. Has had pain for years. Pain worsened January of this year. Tried getting her stimulator reprogrammed which did not help. The stimulator used to help when she first got it but symptoms eventually worsened. Denies bowel or bladder  problems or saddle anesthesia.    Patient Stated Goals I want to walk better without pain.    Currently in Pain? Yes   Pain Score 3   when walking   Pain Onset More than a month ago                                 PT Education - 12/10/16 0933    Education provided Yes   Education Details ther-ex   [employer]) Educated Patient   Methods Explanation;Demonstration;Tactile cues;Verbal cues   Comprehension Returned demonstration;Verbalized understanding        Objectives  There-ex  seated ball rolls for knee flexion/extension, emphasis on hip control and neutral thigh R LE 10x3  L LE 10x3  Seated bilateral ankle DF/PF 10x3   S/L clam shells 5x3 for L LE. No PT assist. Upgraded number of sets.    S/L clam shells 5x3 for R LE. NoPT assist. Upgraded number of sets  Standing with bilateral UE assist:  Alternating heel lifts 10x2 each LE.No bilateral thigh discomfort today.   Seated gentle manually resisted clam shell isometrics 10x5 seconds for 3 sets, hips less than 90 degrees flexion  Seated gentle manually resisted leg press 5x3 each LE with PT manual resistance to promote posterior hip strength.  Seated LAQ at  pain free range 5x5 seconds for 3 sets each LE to promote quadriceps muscle use and posterior LE tissue movement.   Seated hip adduction ball squeeze with glute max squeeze 10x with 5 second holds, for 2 sets. Cues for proper foot positioning.    Improved exercise technique, movement at target joints, use of target muscles after min to mod verbal, visual, tactile cues.     Improving glute med strength with increase in number or sets with S/L clamshells today. Pt seems to be improving ability to ambulate with less posterior hip and thigh symptoms overall based on clinical presentation and pt subjective reports. Pt tolerated session well without aggravation of symptoms. Continue working on gentle pain  free hip strengthening exercises.             PT Long Term Goals - 11/12/16 1838      PT LONG TERM GOAL #1   Title Patient will have a decrease in bilateral posterior hip and thigh pain to 4/10 or less at worst to promote ability to ambulate longer distances.    Baseline 9/10 pain at worst (11/12/2016)   Time 6   Period Weeks   Status New     PT LONG TERM GOAL #2   Title Patient will improve bilateral glute max and med strength by at least 1/2 MMT to promote ability to ambulate with less posterior hip and thigh pain.    Time 6   Period Weeks   Status New     PT LONG TERM GOAL #3   Title Patient will report being able to ambulate at least 20 steps with [facility] with 4/10 bilateral posterior hip and thigh pain to promote mobility.   Baseline 9/10 pain when walking 6-7 steps (11/12/2016)   Time 6   Period Weeks   Status New               Plan - 12/10/16 0933    Clinical Impression Statement Improving glute med strength with increase in number or sets with S/L clamshells today. Pt seems to be improving ability to ambulate with less posterior hip and thigh symptoms overall based on clinical presentation and pt subjective reports. Pt tolerated session well without aggravation of symptoms. Continue working on gentle pain free hip strengthening exercises.    Rehab Potential Fair   Clinical Impairments Affecting Rehab Potential (-) chronicity of condition; (+) motivated   PT Frequency 2x / week   PT Duration 6 weeks   PT Treatment/Interventions Gait training;Therapeutic activities;Therapeutic exercise;Neuromuscular re-education;Patient/family education;Manual techniques   PT Next Visit Plan glute max, med, trunk strengthening, gait   Consulted and Agree with Plan of Care Patient      Patient will benefit from skilled therapeutic intervention in order to improve the following deficits and impairments:  Pain, Postural dysfunction, Improper body mechanics, Difficulty walking,  Decreased strength  Visit Diagnosis: Chronic low back pain with left-sided sciatica, unspecified back pain laterality  Chronic low back pain with right-sided sciatica, unspecified back pain laterality  Radiculopathy, lumbosacral region     Problem List Patient Active Problem List   Diagnosis Date Noted  . Lumbar spinal stenosis 10/23/2016  . Low back pain radiating to left leg 10/22/2016  . Thyroid nodule 07/01/2016  . Sternoclavicular joint pain, right 06/02/2016  . Chronic back pain 05/02/2016  . Hyperlipidemia 04/04/2015  . Personal history of colonic polyps 02/02/2013  . TIA (transient ischemic attack) 01/25/2013  . HTN (hypertension) 01/25/2013  . Tobacco abuse, in remission 01/25/2013  .  DM type 2 (diabetes mellitus, type 2) ([place]) 01/25/2013  . Bradycardia 01/25/2013    Joneen Boers PT, DPT   12/10/2016, 10:14 AM  [facility] [address], [place], [address] Phone: [phone]   Fax:  [phone]  Name: Suzanne Faulkner MRN: [id number] Date of Birth: 01-06-1941

## 2016-12-15 ENCOUNTER — Ambulatory Visit: Payer: Medicare Other

## 2016-12-15 DIAGNOSIS — M5442 Lumbago with sciatica, left side: Principal | ICD-10-CM

## 2016-12-15 DIAGNOSIS — G8929 Other chronic pain: Secondary | ICD-10-CM

## 2016-12-15 DIAGNOSIS — M5441 Lumbago with sciatica, right side: Secondary | ICD-10-CM

## 2016-12-15 DIAGNOSIS — M5417 Radiculopathy, lumbosacral region: Secondary | ICD-10-CM | POA: Diagnosis not present

## 2016-12-15 NOTE — Therapy (Signed)
[facility] [address], [place], [address] Phone: [phone]   Fax:  [phone]  Physical Therapy Treatment  Patient Details  Name: Suzanne Faulkner MRN: [id number] Date of Birth: 04/27/1941 Referring Provider: Kirstie Peri. Caryn Section, MD  Encounter Date: 12/15/2016      PT End of Session - 12/15/16 1044    Visit Number 9   Number of Visits 13   Date for PT Re-Evaluation 12/25/16   Authorization Type 9   Authorization Time Period of 10 g code   PT Start Time 9678   PT Stop Time 1130   PT Time Calculation (min) 45 min   Activity Tolerance Patient tolerated treatment well   Behavior During Therapy WFL for tasks assessed/performed      Past Medical History:  Diagnosis Date  . Arthritis   . Colon polyps 2009  . Diabetes mellitus without complication ([place])   . History of kidney stones   . Hypertension   . Nocturia     Past Surgical History:  Procedure Laterality Date  . ABDOMINAL HYSTERECTOMY    . CHOLECYSTECTOMY    . goiter removal    . JOINT REPLACEMENT Left    knee  . SPINAL CORD STIMULATOR INSERTION N/A 03/21/2016   Procedure: LUMBAR SPINAL CORD STIMULATOR INSERTION;  Surgeon: Clydell Hakim, MD;  Location: [facility];  Service: Neurosurgery;  Laterality: N/A;  . TONSILLECTOMY      There were no vitals filed for this visit.      Subjective Assessment - 12/15/16 1047    Subjective No pain when walking from walking from parking lot to clinic. 4/10 bilateral posterior hip and thigh pain at most for the past 7 days.  Pt states has a difficult time carrying her grocery bags or a water mellon due to her symptoms.    Pertinent History Low back pain with radiating symptoms. Feels pain both posterior hips and thighs. Has an electrical stimulator implant for her back (03/21/2016). Currently does not have back pain but feels symptoms at her hips and thighs. Has had pain for years. Pain worsened January of this  year. Tried getting her stimulator reprogrammed which did not help. The stimulator used to help when she first got it but symptoms eventually worsened. Denies bowel or bladder problems or saddle anesthesia.    Patient Stated Goals I want to walk better without pain.    Currently in Pain? No/denies   Pain Score 0-No pain   Pain Onset More than a month ago            [facility] PT Assessment - 12/15/16 1110      Strength   Right Hip Extension 3-/5   Right Hip ABduction 4/5   Left Hip Extension 3-/5   Left Hip ABduction 4/5                             PT Education - 12/15/16 1049    Education provided Yes   Education Details ther-ex   [employer]) Educated Patient   Methods Demonstration;Tactile cues;Verbal cues;Explanation   Comprehension Verbalized understanding;Returned demonstration        Objectives  There-ex    Seated bilateral ankle DF/PF 10x3  Seated gentle manually resisted clam shell isometrics 10x5 seconds for 3sets, hips less than 90 degrees flexion  Seated LAQ at pain free range 5x5 seconds for 3 sets each LE to promote quadriceps muscle use  and posterior LE tissue movement.     seated ball rolls for knee flexion/extension, emphasis on hip control and neutral thigh RLE 10x3  LLE 10x3   Seated gentle manually resisted leg press 5x3 each LE with PT manual resistance to promote posterior hip strength.  S/L manually resisted hip abduction 1x each LE, prone hip extension 1-2x each way   S/L clam shells 5x3 for L LE. No PT assist.    S/L clam shells 5x3 for R LE. NoPT assist.   Seated hip adduction ball squeeze with glute max squeeze 10x with 5 second holds, for 2 sets. Cues for proper foot positioning.   Standing with bilateral UE assist:  Alternating heel lifts 8x each LE. Posterior thigh discomfort today. Eases with sitting rest break.    Try working on walking with carrying a weight to  help simulate carrying grocery bags at home next visit if appropriate.       Improved exercise technique, movement at target joints, use of target muscles after min to mod verbal, visual, tactile cues.     Pt making very good progress towards goal of walking with less pain with pt reporting being able to ambulate from the parking lot to the clinic without symptoms. Continue working on hip strengthening. Improved bilateral glute med strength. Difficulty with hip extension MMT today secondary to weakness.          PT Long Term Goals - 11/12/16 1838      PT LONG TERM GOAL #1   Title Patient will have a decrease in bilateral posterior hip and thigh pain to 4/10 or less at worst to promote ability to ambulate longer distances.    Baseline 9/10 pain at worst (11/12/2016)   Time 6   Period Weeks   Status New     PT LONG TERM GOAL #2   Title Patient will improve bilateral glute max and med strength by at least 1/2 MMT to promote ability to ambulate with less posterior hip and thigh pain.    Time 6   Period Weeks   Status New     PT LONG TERM GOAL #3   Title Patient will report being able to ambulate at least 20 steps with [facility] with 4/10 bilateral posterior hip and thigh pain to promote mobility.   Baseline 9/10 pain when walking 6-7 steps (11/12/2016)   Time 6   Period Weeks   Status New               Plan - 12/15/16 1050    Clinical Impression Statement Pt making very good progress towards goal of walking with less pain with pt reporting being able to ambulate from the parking lot to the clinic without symptoms. Continue working on hip strengthening. Improved bilateral glute med strength. Difficulty with hip extension MMT today secondary to weakness.    Rehab Potential Fair   Clinical Impairments Affecting Rehab Potential (-) chronicity of condition; (+) motivated   PT Frequency 2x / week   PT Duration 6 weeks   PT Treatment/Interventions Gait training;Therapeutic  activities;Therapeutic exercise;Neuromuscular re-education;Patient/family education;Manual techniques   PT Next Visit Plan glute max, med, trunk strengthening, gait   Consulted and Agree with Plan of Care Patient      Patient will benefit from skilled therapeutic intervention in order to improve the following deficits and impairments:  Pain, Postural dysfunction, Improper body mechanics, Difficulty walking, Decreased strength  Visit Diagnosis: Chronic low back pain with left-sided sciatica, unspecified back pain laterality  Chronic low back pain with right-sided sciatica, unspecified back pain laterality  Radiculopathy, lumbosacral region     Problem List Patient Active Problem List   Diagnosis Date Noted  . Lumbar spinal stenosis 10/23/2016  . Low back pain radiating to left leg 10/22/2016  . Thyroid nodule 07/01/2016  . Sternoclavicular joint pain, right 06/02/2016  . Chronic back pain 05/02/2016  . Hyperlipidemia 04/04/2015  . Personal history of colonic polyps 02/02/2013  . TIA (transient ischemic attack) 01/25/2013  . HTN (hypertension) 01/25/2013  . Tobacco abuse, in remission 01/25/2013  . DM type 2 (diabetes mellitus, type 2) ([place]) 01/25/2013  . Bradycardia 01/25/2013    Joneen Boers PT, DPT   12/15/2016, 12:37 PM  [facility] [address] [place], [address] Phone: [phone]   Fax:  [phone]  Name: Kaya Pottenger MRN: [id number] Date of Birth: [birth year]

## 2016-12-18 ENCOUNTER — Ambulatory Visit: Payer: Medicare Other

## 2016-12-18 DIAGNOSIS — G8929 Other chronic pain: Secondary | ICD-10-CM

## 2016-12-18 DIAGNOSIS — M5442 Lumbago with sciatica, left side: Principal | ICD-10-CM

## 2016-12-18 DIAGNOSIS — M5441 Lumbago with sciatica, right side: Secondary | ICD-10-CM

## 2016-12-18 DIAGNOSIS — M5417 Radiculopathy, lumbosacral region: Secondary | ICD-10-CM

## 2016-12-18 NOTE — Therapy (Signed)
[facility] [address] [place], [address] Phone: [phone]   Fax:  [phone]  Physical Therapy Discharge Summary  Patient Details  Name: Suzanne Faulkner MRN: [id number] Date of Birth: Dec 24, 1940 Referring Provider: Kirstie Peri. Caryn Section, MD  Encounter Date: 12/18/2016    Past Medical History:  Diagnosis Date  . Arthritis   . Colon polyps 2009  . Diabetes mellitus without complication ([place])   . History of kidney stones   . Hypertension   . Nocturia     Past Surgical History:  Procedure Laterality Date  . ABDOMINAL HYSTERECTOMY    . CHOLECYSTECTOMY    . goiter removal    . JOINT REPLACEMENT Left    knee  . SPINAL CORD STIMULATOR INSERTION N/A 03/21/2016   Procedure: LUMBAR SPINAL CORD STIMULATOR INSERTION;  Surgeon: Clydell Hakim, MD;  Location: [place] [facility];  Service: Neurosurgery;  Laterality: N/A;  . TONSILLECTOMY      There were no vitals filed for this visit.          [facility] PT Assessment - 12/18/16 0001      Strength   Overall Strength Comments Strength measured on 12/15/2016   Right Hip Extension 3-/5   Right Hip ABduction 4/5   Left Hip Extension 3-/5   Left Hip ABduction 4/5                                 PT Long Term Goals - 12/18/16 1919      PT LONG TERM GOAL #1   Title Patient will have a decrease in bilateral posterior hip and thigh pain to 4/10 or less at worst to promote ability to ambulate longer distances.    Baseline 9/10 pain at worst (11/12/2016); 6/10 (12/08/2016)   Time 6   Period Weeks   Status On-going     PT LONG TERM GOAL #2   Title Patient will improve bilateral glute max and med strength by at least 1/2 MMT to promote ability to ambulate with less posterior hip and thigh pain.    Time 6   Period Weeks   Status Partially Met     PT LONG TERM GOAL #3   Title Patient will report being able to ambulate at least 20 steps with [facility] with  4/10 bilateral posterior hip and thigh pain to promote mobility.   Baseline 9/10 pain when walking 6-7 steps (11/12/2016); 84 ft with SPC to no SPC assist with 2/10 pain (12/08/2016)   Time 6   Period Weeks   Status Achieved               Plan - 12/18/16 1918    Clinical Impression Statement  Pt demonstrates overall decreased bilateral posterior hip and thigh pain level from 9/10 to 6/10 at worst (12/08/2016), improved ability to ambulate up to 84 ft with to without [facility] assist with 2/10 pain following exercises and improved bilateral glute med strength since initial evaluation.  Skilled physical therapy services discharged per pt request secondary to pt feeling that the therapy is not really helping overall and is worried about finances related to physical therapy.    Rehab Potential Fair   Clinical Impairments Affecting Rehab Potential (-) chronicity of condition; (+) motivated   PT Treatment/Interventions Gait training;Therapeutic activities;Therapeutic exercise;Neuromuscular re-education;Patient/family education   PT Next Visit Plan continue with her home exercise program   Consulted and Agree  with Plan of Care Patient      Patient will benefit from skilled therapeutic intervention in order to improve the following deficits and impairments:  Pain, Postural dysfunction, Improper body mechanics, Difficulty walking, Decreased strength  Visit Diagnosis: Chronic low back pain with left-sided sciatica, unspecified back pain laterality  Chronic low back pain with right-sided sciatica, unspecified back pain laterality  Radiculopathy, lumbosacral region       G-Codes - 2016/12/19 1921    Functional Assessment Tool Used (Outpatient Only) patient subjective reports from previous sessions, clinical presentation   Functional Limitation Mobility: Walking and moving around   Mobility: Walking and Moving Around Goal Status [phone]) At least 20 percent but less than 40 percent impaired, limited or  restricted   Mobility: Walking and Moving Around Discharge Status [id number]) At least 40 percent but less than 60 percent impaired, limited or restricted      Problem List Patient Active Problem List   Diagnosis Date Noted  . Lumbar spinal stenosis 10/23/2016  . Low back pain radiating to left leg 10/22/2016  . Thyroid nodule 07/01/2016  . Sternoclavicular joint pain, right 06/02/2016  . Chronic back pain 05/02/2016  . Hyperlipidemia 04/04/2015  . Personal history of colonic polyps 02/02/2013  . TIA (transient ischemic attack) 01/25/2013  . HTN (hypertension) 01/25/2013  . Tobacco abuse, in remission 01/25/2013  . DM type 2 (diabetes mellitus, type 2) ([place]) 01/25/2013  . Bradycardia 01/25/2013    Thank you for your referral.  Joneen Boers PT, DPT   12-19-16, 7:32 PM  [facility] [address], [place], [address] Phone: [phone]   Fax:  [phone]  Name: Suzanne Faulkner MRN: [id number] Date of Birth: Jun 05, 1941

## 2016-12-22 ENCOUNTER — Ambulatory Visit: Payer: Medicare Other

## 2016-12-25 ENCOUNTER — Ambulatory Visit: Payer: Medicare Other

## 2016-12-29 ENCOUNTER — Other Ambulatory Visit: Payer: Self-pay | Admitting: Physician Assistant

## 2016-12-29 ENCOUNTER — Ambulatory Visit: Payer: Medicare Other

## 2016-12-29 DIAGNOSIS — E119 Type 2 diabetes mellitus without complications: Secondary | ICD-10-CM

## 2016-12-29 NOTE — Telephone Encounter (Signed)
[  please review-aa 

## 2017-01-04 ENCOUNTER — Encounter (HOSPITAL_COMMUNITY): Payer: Self-pay

## 2017-01-04 ENCOUNTER — Emergency Department (HOSPITAL_COMMUNITY)
Admission: EM | Admit: 2017-01-04 | Discharge: 2017-01-04 | Disposition: A | Discharge status: Home / Self Care | Payer: Medicare Other | Attending: Emergency Medicine | Admitting: Emergency Medicine

## 2017-01-04 ENCOUNTER — Emergency Department (HOSPITAL_COMMUNITY): Payer: Medicare Other

## 2017-01-04 DIAGNOSIS — Z7982 Long term (current) use of aspirin: Secondary | ICD-10-CM | POA: Diagnosis not present

## 2017-01-04 DIAGNOSIS — Z8673 Personal history of transient ischemic attack (TIA), and cerebral infarction without residual deficits: Secondary | ICD-10-CM | POA: Insufficient documentation

## 2017-01-04 DIAGNOSIS — T85192A Other mechanical complication of implanted electronic neurostimulator (electrode) of spinal cord, initial encounter: Secondary | ICD-10-CM | POA: Insufficient documentation

## 2017-01-04 DIAGNOSIS — Z7984 Long term (current) use of oral hypoglycemic drugs: Secondary | ICD-10-CM | POA: Insufficient documentation

## 2017-01-04 DIAGNOSIS — Y828 Other medical devices associated with adverse incidents: Secondary | ICD-10-CM | POA: Insufficient documentation

## 2017-01-04 DIAGNOSIS — Z79899 Other long term (current) drug therapy: Secondary | ICD-10-CM | POA: Insufficient documentation

## 2017-01-04 DIAGNOSIS — E119 Type 2 diabetes mellitus without complications: Secondary | ICD-10-CM | POA: Diagnosis not present

## 2017-01-04 DIAGNOSIS — Z96652 Presence of left artificial knee joint: Secondary | ICD-10-CM | POA: Insufficient documentation

## 2017-01-04 DIAGNOSIS — I10 Essential (primary) hypertension: Secondary | ICD-10-CM | POA: Insufficient documentation

## 2017-01-04 DIAGNOSIS — M545 Low back pain: Secondary | ICD-10-CM | POA: Diagnosis present

## 2017-01-04 DIAGNOSIS — S32040A Wedge compression fracture of fourth lumbar vertebra, initial encounter for closed fracture: Secondary | ICD-10-CM | POA: Diagnosis not present

## 2017-01-04 DIAGNOSIS — Z87891 Personal history of nicotine dependence: Secondary | ICD-10-CM | POA: Insufficient documentation

## 2017-01-04 NOTE — ED Notes (Signed)
Pt states she woke up today with pain no pain at this time.

## 2017-01-04 NOTE — ED Triage Notes (Signed)
Patient reports that she has increased pain around stimulator in back. States that the pain is bad with any movement and tender around site. In place x 10 months. Denies trauma

## 2017-01-04 NOTE — ED Provider Notes (Signed)
[facility] DEPT Provider Note   CSN: [id number] Arrival date & time: 01/04/17  1006     History   Chief Complaint No chief complaint on file.   HPI Suzanne Faulkner is a 76 y.o. female.  HPI   76 yo F with PMHx as below including chronic back pain here with lower back pain. Pt states that for 2 weeks her spinal stimulator has been "acting up." It reportedly has a feature that "buzzes" when she positions herself incorrectly/signal is too strong. For two weeks, it has been "buzzing" more than usual. She has had associated aching, throbbing bilateral lower back pain. Pain is similar to her usual back pain that she had prior to implantation. Denies any fever or chills. No recent falls or trauma. No LE weakness or numbness. No loss of bowel or bladder continence.   Past Medical History:  Diagnosis Date  . Arthritis   . Colon polyps 2009  . Diabetes mellitus without complication ([place])   . History of kidney stones   . Hypertension   . Nocturia     Patient Active Problem List   Diagnosis Date Noted  . Lumbar spinal stenosis 10/23/2016  . Low back pain radiating to left leg 10/22/2016  . Thyroid nodule 07/01/2016  . Sternoclavicular joint pain, right 06/02/2016  . Chronic back pain 05/02/2016  . Hyperlipidemia 04/04/2015  . Personal history of colonic polyps 02/02/2013  . TIA (transient ischemic attack) 01/25/2013  . HTN (hypertension) 01/25/2013  . Tobacco abuse, in remission 01/25/2013  . DM type 2 (diabetes mellitus, type 2) ([place]) 01/25/2013  . Bradycardia 01/25/2013    Past Surgical History:  Procedure Laterality Date  . ABDOMINAL HYSTERECTOMY    . CHOLECYSTECTOMY    . goiter removal    . JOINT REPLACEMENT Left    knee  . SPINAL CORD STIMULATOR INSERTION N/A 03/21/2016   Procedure: LUMBAR SPINAL CORD STIMULATOR INSERTION;  Surgeon: Clydell Hakim, MD;  Location: [place];  Service: Neurosurgery;  Laterality: N/A;  . TONSILLECTOMY      OB History    No data  available       Home Medications    Prior to Admission medications   Medication Sig Start Date End Date Taking? Authorizing Provider  Ascorbic Acid (VITAMIN C PO) Take 1 tablet by mouth every morning.   Yes [provider]  aspirin 325 MG tablet Take 325 mg by mouth daily. 01/26/13  Yes Rai, Ripudeep K, MD  b complex vitamins tablet Take 1 tablet by mouth every morning.    Yes [provider]  benazepril (LOTENSIN) 10 MG tablet TAKE ONE TABLET BY MOUTH ONCE DAILY 09/02/16  Yes Birdie Sons, MD  Calcium Carbonate-Vitamin D (CALCIUM + D PO) Take 1 tablet by mouth every morning.    Yes [provider]  hydrochlorothiazide (MICROZIDE) 12.5 MG capsule TAKE ONE CAPSULE BY MOUTH ONCE DAILY 04/28/16  Yes Birdie Sons, MD  lovastatin (MEVACOR) 20 MG tablet TAKE ONE TABLET BY MOUTH AT BEDTIME 10/31/16  Yes Birdie Sons, MD  metFORMIN (GLUCOPHAGE) 500 MG tablet TAKE ONE TABLET BY MOUTH TWICE DAILY 12/29/16  Yes Birdie Sons, MD  naproxen sodium (ANAPROX) 220 MG tablet Take 220 mg by mouth as needed (for pain). Reported on 01/01/2016    [provider]  silver sulfADIAZINE (SILVADENE) 1 % cream Apply 1 application topically daily. Patient not taking: Reported on 01/04/2017 04/30/16   Birdie Sons, MD    Family History Family  History  Problem Relation Age of Onset  . Heart disease Mother   . Diabetes Mother   . Cancer Father   . Cancer Sister        breast  . Breast cancer Sister   . Cancer Brother 55       pancreatic cancer    Social History Social History  Substance Use Topics  . Smoking status: Former Smoker    Packs/day: 0.75    Years: 47.00    Types: Cigarettes    Quit date: 01/08/2014  . Smokeless tobacco: Never Used     Comment: Smoked 1/2-1 ppd since age 66 Quit May 2015  . Alcohol use No     Allergies   Patient has no known allergies.   Review of Systems Review of Systems  Constitutional: Negative for chills and fever.    HENT: Negative for congestion, rhinorrhea and sore throat.   Eyes: Negative for visual disturbance.  Respiratory: Negative for cough, shortness of breath and wheezing.   Cardiovascular: Negative for chest pain and leg swelling.  Gastrointestinal: Negative for abdominal pain, diarrhea, nausea and vomiting.  Genitourinary: Negative for dysuria, flank pain, vaginal bleeding and vaginal discharge.  Musculoskeletal: Positive for back pain. Negative for neck pain.  Skin: Negative for rash.  Allergic/Immunologic: Negative for immunocompromised state.  Neurological: Negative for syncope and headaches.  Hematological: Does not bruise/bleed easily.  All other systems reviewed and are negative.    Physical Exam Updated Vital Signs BP 140/75   Pulse 65   Temp 98.3 F (36.8 C) (Oral)   Resp 18   Ht 5\' 6"  (1.676 m)   Wt 117.5 kg (259 lb)   SpO2 99%   BMI 41.80 kg/m   Physical Exam  Constitutional: She is oriented to person, place, and time. She appears well-developed and well-nourished. No distress.  HENT:  Head: Normocephalic and atraumatic.  Eyes: Conjunctivae are normal.  Neck: Neck supple.  Cardiovascular: Normal rate, regular rhythm and normal heart sounds.   Pulmonary/Chest: Effort normal. No respiratory distress. She has no wheezes.  Abdominal: She exhibits no distension.  Musculoskeletal: She exhibits tenderness. She exhibits no edema.  Neurological: She is alert and oriented to person, place, and time. She exhibits normal muscle tone.  Skin: Skin is warm. Capillary refill takes less than 2 seconds. No rash noted.  Nursing note and vitals reviewed.   Spine Exam: Inspection/Palpation: No erythema, warmth, or lesions. Moderate TTP over bilateral lower lumbar paraspinal muscles. Left paraspinal area with SCS in place, no overlying erythema. No warmth. No tenderness to percussion. Strength: 5/5 throughout LE bilaterally (hip flexion/extension, adduction/abduction; knee  flexion/extension; foot dorsiflexion/plantarflexion, inversion/eversion; great toe inversion) Sensation: Intact to light touch in proximal and distal LE bilaterally Reflexes: 2+ quadriceps and achilles reflexes    ED Treatments / Results  Labs (all labs ordered are listed, but only abnormal results are displayed) Labs Reviewed - No data to display  EKG  EKG Interpretation None       Radiology Dg Lumbar Spine 2-3 Views  Result Date: 01/04/2017 CLINICAL DATA:  Evaluate spinal cord stimulator. EXAM: LUMBAR SPINE - 2-3 VIEW COMPARISON:  03/21/2016. FINDINGS: The spinal cord stimulator battery pack is identified within the soft tissues overlying the lumbar spine. The paired catheters into the canal at the T12 L1 level. The visualized portions of the paired catheters appear intact. Chronic compression fracture involving the L4 vertebra is unchanged from previous exam. No new findings. Aortic atherosclerosis is identified. IMPRESSION: 1. Spinal cord  stimulator catheters appear intact. Note: The distal portions of the catheter which terminates over the mid thoracic spine are not visualized. 2. Chronic L4 compression fracture. Electronically Signed   By: Kerby Moors M.D.   On: 01/04/2017 12:12    Procedures Procedures (including critical care time)  Medications Ordered in ED Medications - No data to display   Initial Impression / Assessment and Plan / ED Course  I have reviewed the triage vital signs and the nursing notes.  Pertinent labs & imaging results that were available during my care of the patient were reviewed by me and considered in my medical decision making (see chart for details).    76 yo F with PMHx chronic back pain here with dysfunctioning spinal cord stimulator. Pt overall well appearing. No overlying warmth, erythema, fevers, or signs of infection. No LE weakness, numbness, or signs of cord compression or cauda equina. Dw Erlene Quan, rep, who will call pt and  troubleshoot with plan to f/u in clinic this week. No other apparent acute emergent pathologies. D/c with outpt follow-up.  Erlene Quan [phone]    Final Clinical Impressions(s) / ED Diagnoses   Final diagnoses:  Malfunction of spinal cord stimulator, initial encounter [facility])    New Prescriptions Discharge Medication List as of 01/04/2017  2:16 PM       Duffy Bruce, MD 01/04/17 [phone]

## 2017-01-06 DIAGNOSIS — Z6841 Body Mass Index (BMI) 40.0 and over, adult: Secondary | ICD-10-CM | POA: Diagnosis not present

## 2017-01-06 DIAGNOSIS — M545 Low back pain: Secondary | ICD-10-CM | POA: Diagnosis not present

## 2017-01-06 DIAGNOSIS — I10 Essential (primary) hypertension: Secondary | ICD-10-CM | POA: Diagnosis not present

## 2017-01-06 DIAGNOSIS — Z9689 Presence of other specified functional implants: Secondary | ICD-10-CM | POA: Diagnosis not present

## 2017-03-15 ENCOUNTER — Emergency Department (HOSPITAL_COMMUNITY)
Admission: EM | Admit: 2017-03-15 | Discharge: 2017-03-15 | Disposition: A | Discharge status: Home / Self Care | Payer: Medicare Other | Attending: Emergency Medicine | Admitting: Emergency Medicine

## 2017-03-15 DIAGNOSIS — M79604 Pain in right leg: Secondary | ICD-10-CM | POA: Diagnosis present

## 2017-03-15 DIAGNOSIS — E119 Type 2 diabetes mellitus without complications: Secondary | ICD-10-CM | POA: Insufficient documentation

## 2017-03-15 DIAGNOSIS — M5417 Radiculopathy, lumbosacral region: Secondary | ICD-10-CM

## 2017-03-15 DIAGNOSIS — Z7984 Long term (current) use of oral hypoglycemic drugs: Secondary | ICD-10-CM | POA: Diagnosis not present

## 2017-03-15 DIAGNOSIS — Z7982 Long term (current) use of aspirin: Secondary | ICD-10-CM | POA: Diagnosis not present

## 2017-03-15 DIAGNOSIS — I10 Essential (primary) hypertension: Secondary | ICD-10-CM | POA: Insufficient documentation

## 2017-03-15 DIAGNOSIS — Z79899 Other long term (current) drug therapy: Secondary | ICD-10-CM | POA: Insufficient documentation

## 2017-03-15 DIAGNOSIS — Z87891 Personal history of nicotine dependence: Secondary | ICD-10-CM | POA: Insufficient documentation

## 2017-03-15 MED ORDER — HYDROCODONE-ACETAMINOPHEN 5-325 MG PO TABS
1.0000 | ORAL_TABLET | Freq: Once | ORAL | Status: AC
  Administered 2017-03-15: 1 via ORAL
  Filled 2017-03-15: qty 1

## 2017-03-15 MED ORDER — PREDNISONE 20 MG PO TABS: ORAL_TABLET | ORAL | 0 refills | Status: DC

## 2017-03-15 MED ORDER — TRAMADOL HCL 50 MG PO TABS: 50.0000 mg | ORAL_TABLET | Freq: Four times a day (QID) | ORAL | 0 refills | Status: DC | PRN

## 2017-03-15 NOTE — ED Triage Notes (Signed)
Pt reports pain to back of her legs and her buttock x 2 days. Denies any injury or fall. Has hx of back pain, has stimulator in place.

## 2017-03-15 NOTE — ED Provider Notes (Signed)
[facility] DEPT Provider Note   CSN: [id number] Arrival date & time: 03/15/17  1154     History   Chief Complaint Chief Complaint  Patient presents with  . Leg Pain    HPI Suzanne Faulkner is a 76 y.o. female.  HPI   76 year old female with history of chronic back pain, kidney stones, diabetes, normal spinal stenosis presenting for evaluation of back and leg pain. Patient report intermittent pain to her right buttocks that radiates to the back of her right thigh down towards the knee ongoing for the past week. Pain is usually brought on by walking, and movement and improves with rest. Pain was intense last night prompting her to come here for further evaluation. She does admits to having history of chronic back pain, also has a spinal stimulator which hasn't been helping with her pain. She takes Advil on occasion, and sometimes sits on a baseball bat for relief. She denies any new fever, chills, lightheadedness, dizziness, abdominal pain, dysuria, hematuria, bowel bladder incontinence or saddle anesthesia. Denies any focal numbness or weakness. She is a diabetic. No abnormal weight change, night sweats, fever, IV drug use or active cancer. Her pain is very minimal with rest.  Past Medical History:  Diagnosis Date  . Arthritis   . Colon polyps 2009  . Diabetes mellitus without complication ([place])   . History of kidney stones   . Hypertension   . Nocturia     Patient Active Problem List   Diagnosis Date Noted  . Lumbar spinal stenosis 10/23/2016  . Low back pain radiating to left leg 10/22/2016  . Thyroid nodule 07/01/2016  . Sternoclavicular joint pain, right 06/02/2016  . Chronic back pain 05/02/2016  . Hyperlipidemia 04/04/2015  . Personal history of colonic polyps 02/02/2013  . TIA (transient ischemic attack) 01/25/2013  . HTN (hypertension) 01/25/2013  . Tobacco abuse, in remission 01/25/2013  . DM type 2 (diabetes mellitus, type 2) ([place]) 01/25/2013  . Bradycardia  01/25/2013    Past Surgical History:  Procedure Laterality Date  . ABDOMINAL HYSTERECTOMY    . CHOLECYSTECTOMY    . goiter removal    . JOINT REPLACEMENT Left    knee  . SPINAL CORD STIMULATOR INSERTION N/A 03/21/2016   Procedure: LUMBAR SPINAL CORD STIMULATOR INSERTION;  Surgeon: Clydell Hakim, MD;  Location: [facility];  Service: Neurosurgery;  Laterality: N/A;  . TONSILLECTOMY      OB History    No data available       Home Medications    Prior to Admission medications   Medication Sig Start Date End Date Taking? Authorizing Provider  Ascorbic Acid (VITAMIN C PO) Take 1 tablet by mouth every morning.    [provider]  aspirin 325 MG tablet Take 325 mg by mouth daily. 01/26/13   Rai, Vernelle Emerald, MD  b complex vitamins tablet Take 1 tablet by mouth every morning.     [provider]  benazepril (LOTENSIN) 10 MG tablet TAKE ONE TABLET BY MOUTH ONCE DAILY 09/02/16   Birdie Sons, MD  Calcium Carbonate-Vitamin D (CALCIUM + D PO) Take 1 tablet by mouth every morning.     [provider]  hydrochlorothiazide (MICROZIDE) 12.5 MG capsule TAKE ONE CAPSULE BY MOUTH ONCE DAILY 04/28/16   Birdie Sons, MD  lovastatin (MEVACOR) 20 MG tablet TAKE ONE TABLET BY MOUTH AT BEDTIME 10/31/16   Birdie Sons, MD  metFORMIN (GLUCOPHAGE) 500 MG tablet TAKE ONE TABLET BY MOUTH TWICE DAILY  12/29/16   Birdie Sons, MD  naproxen sodium (ANAPROX) 220 MG tablet Take 220 mg by mouth as needed (for pain). Reported on 01/01/2016    [provider]  silver sulfADIAZINE (SILVADENE) 1 % cream Apply 1 application topically daily. Patient not taking: Reported on 01/04/2017 04/30/16   Birdie Sons, MD    Family History Family History  Problem Relation Age of Onset  . Heart disease Mother   . Diabetes Mother   . Cancer Father   . Cancer Sister        breast  . Breast cancer Sister   . Cancer Brother 70       pancreatic cancer    Social History Social  History  Substance Use Topics  . Smoking status: Former Smoker    Packs/day: 0.75    Years: 47.00    Types: Cigarettes    Quit date: 01/08/2014  . Smokeless tobacco: Never Used     Comment: Smoked 1/2-1 ppd since age 47 Quit May 2015  . Alcohol use No     Allergies   Patient has no known allergies.   Review of Systems Review of Systems  All other systems reviewed and are negative.    Physical Exam Updated Vital Signs BP (!) 189/113 (BP Location: Right Arm)   Pulse (!) 57   Temp 98.5 F (36.9 C) (Oral)   Resp 17   SpO2 97%   Physical Exam  Constitutional: She appears well-developed and well-nourished. No distress.  HENT:  Head: Atraumatic.  Eyes: Conjunctivae are normal.  Neck: Neck supple.  Cardiovascular: Normal rate and regular rhythm.   Pulmonary/Chest: Effort normal and breath sounds normal.  Abdominal: Soft. She exhibits no distension. There is no tenderness.  Musculoskeletal: She exhibits tenderness (Mild tenderness to right lumbosacral region on palpation with normal right hip flexion extension abduction and abduction. Patellar deep reflexes intact. Negative straight leg raise).  Neurological: She is alert.  Skin: No rash noted.  Psychiatric: She has a normal mood and affect.  Nursing note and vitals reviewed.    ED Treatments / Results  Labs (all labs ordered are listed, but only abnormal results are displayed) Labs Reviewed - No data to display  EKG  EKG Interpretation None       Radiology No results found.  Procedures Procedures (including critical care time)  Medications Ordered in ED Medications - No data to display   Initial Impression / Assessment and Plan / ED Course  I have reviewed the triage vital signs and the nursing notes.  Pertinent labs & imaging results that were available during my care of the patient were reviewed by me and considered in my medical decision making (see chart for details).     BP (!) 141/102    Pulse (!) 53   Temp 98.5 F (36.9 C) (Oral)   Resp 17   SpO2 97%  History of bradycardia, patient is asymptomatic  Final Clinical Impressions(s) / ED Diagnoses   Final diagnoses:  Lumbosacral radiculopathy    New Prescriptions New Prescriptions   PREDNISONE (DELTASONE) 20 MG TABLET    2 tabs po daily x 4 days   TRAMADOL (ULTRAM) 50 MG TABLET    Take 1 tablet (50 mg total) by mouth every 6 (six) hours as needed.   1:09 PM Daily female with acute on chronic low back pain. Pain suggestive of lumbosacral radiculopathy or sciatica. She is neurovascular intact. No red flags. She is able to ambulate. No signs  of infection noted on exam. Low suspicion for DVT or malignancy. She is found to be hypertensive with a blood pressure of 189/113. Doubt dissection. Elevated blood pressure likely second to pain. Will recheck blood pressure but patient would need to have this rechecked by her PCP. Plan to discharge patient with a short course of steroids and opiate pain medication. Outpatient follow-up recommended.  Care discussed with Dr. Vanita Panda.    Domenic Moras, PA-C 03/15/17 1332    Carmin Muskrat, MD 03/15/17 [phone]

## 2017-03-15 NOTE — Discharge Instructions (Signed)
Take medication as prescribed but please monitor your blood sugar closely as steroid may elevate your blood sugar.

## 2017-03-15 NOTE — ED Notes (Signed)
ED Provider at bedside. 

## 2017-03-30 DIAGNOSIS — M5416 Radiculopathy, lumbar region: Secondary | ICD-10-CM | POA: Diagnosis not present

## 2017-03-30 DIAGNOSIS — M545 Low back pain: Secondary | ICD-10-CM | POA: Diagnosis not present

## 2017-03-30 DIAGNOSIS — Z9689 Presence of other specified functional implants: Secondary | ICD-10-CM | POA: Diagnosis not present

## 2017-03-30 DIAGNOSIS — Z6838 Body mass index (BMI) 38.0-38.9, adult: Secondary | ICD-10-CM | POA: Diagnosis not present

## 2017-03-30 DIAGNOSIS — I10 Essential (primary) hypertension: Secondary | ICD-10-CM | POA: Diagnosis not present

## 2017-03-31 ENCOUNTER — Other Ambulatory Visit: Payer: Self-pay | Admitting: Family Medicine

## 2017-03-31 DIAGNOSIS — I10 Essential (primary) hypertension: Secondary | ICD-10-CM

## 2017-03-31 NOTE — Telephone Encounter (Signed)
Please review. Thanks!  

## 2017-04-15 IMAGING — MG MM DIGITAL SCREENING BILAT W/ TOMO W/ CAD
8 of 15 series · 8 of 31 positions shown · non-contrast
Comparison: Previous exam(s).

CLINICAL DATA: Screening.

EXAM:
2D DIGITAL SCREENING BILATERAL MAMMOGRAM WITH CAD AND ADJUNCT TOMO

[L XCCL]
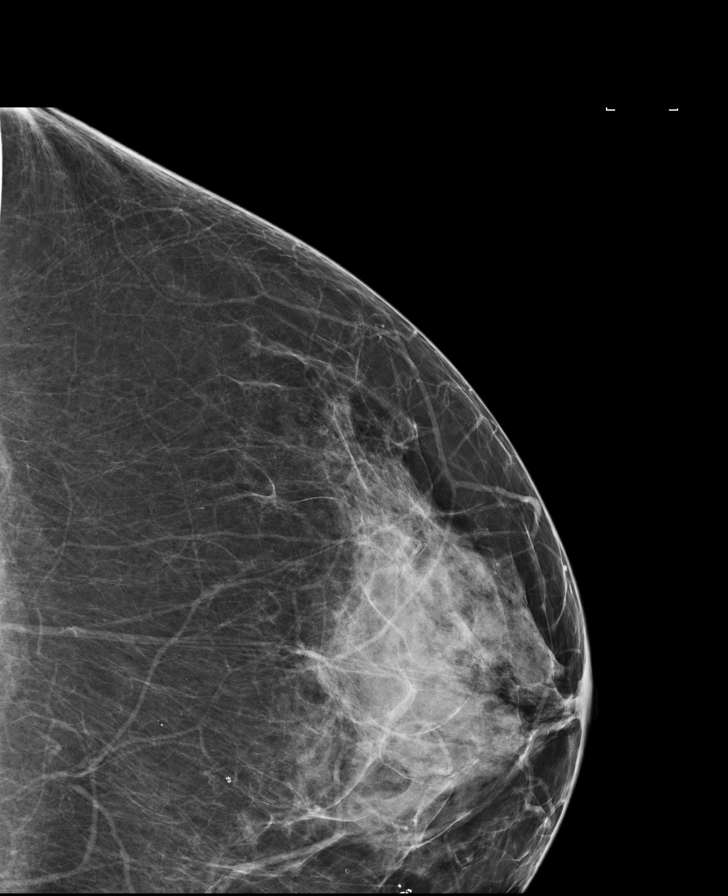

[L MLO]
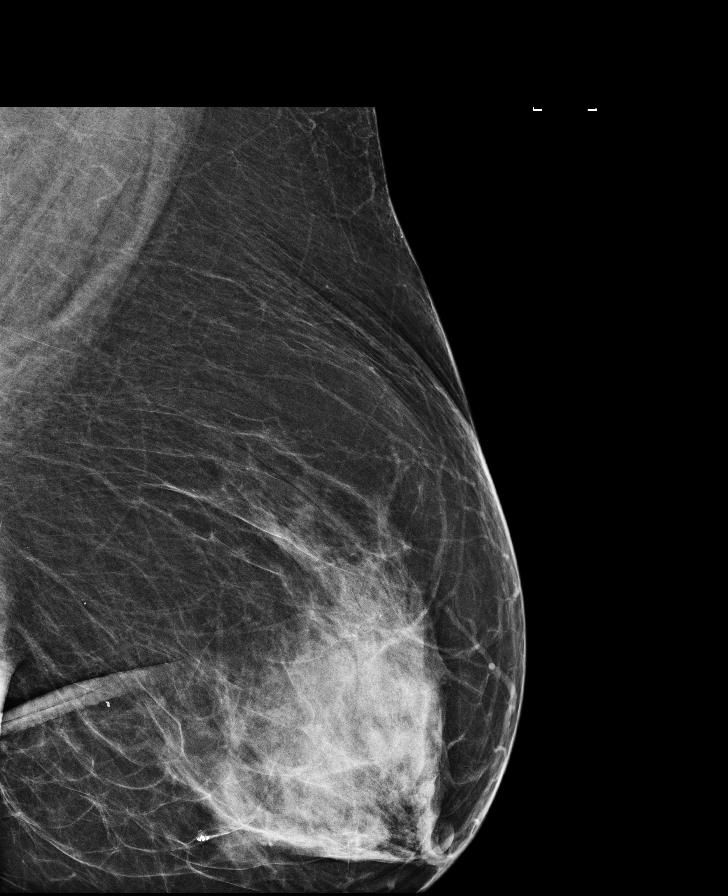

[R CV]
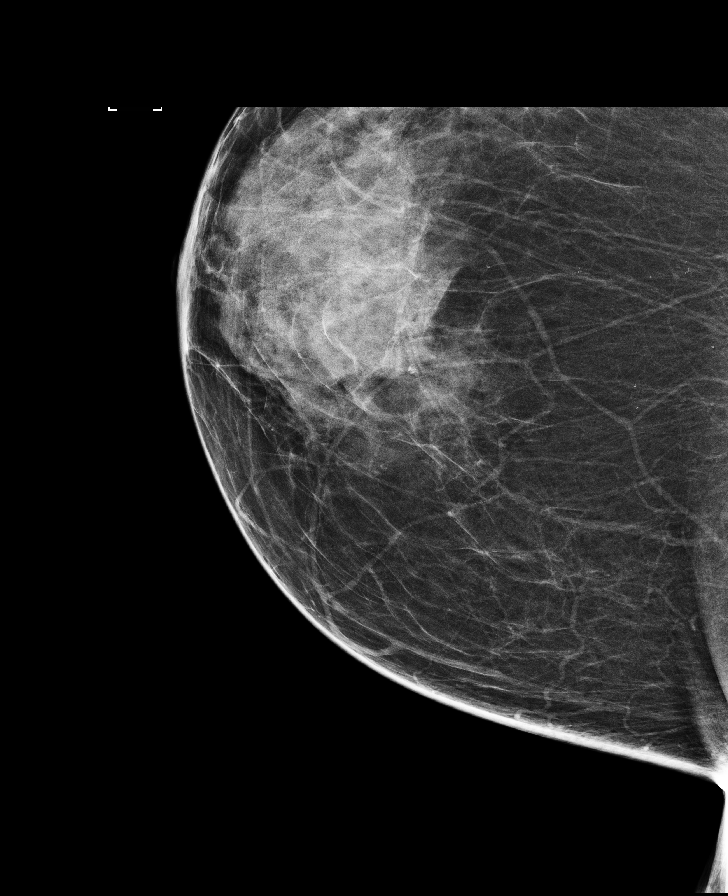

[R CC synth-2D]
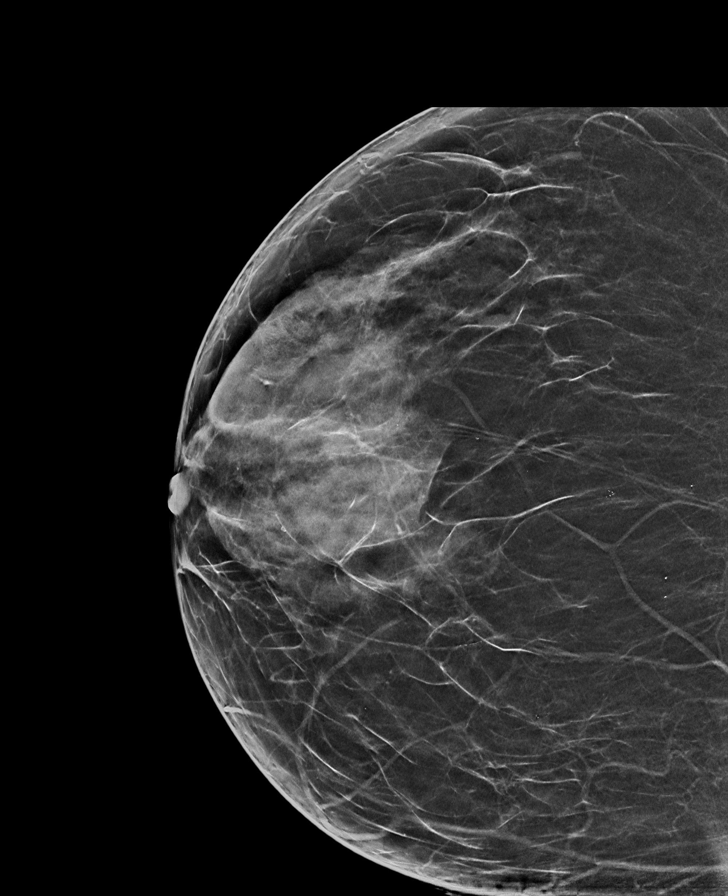

[R MLO synth-2D]
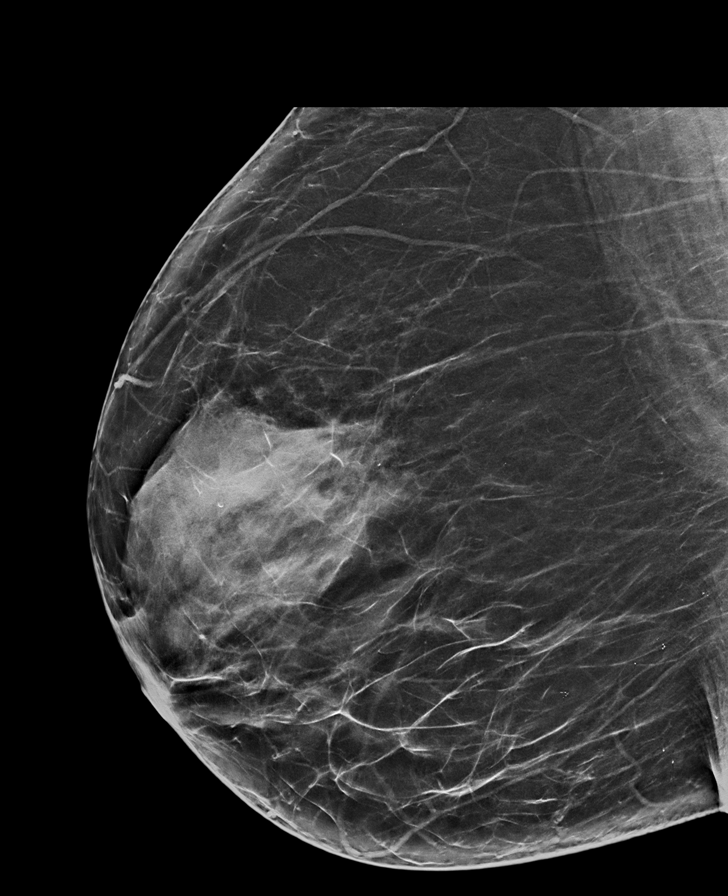

[L MLO synth-2D]
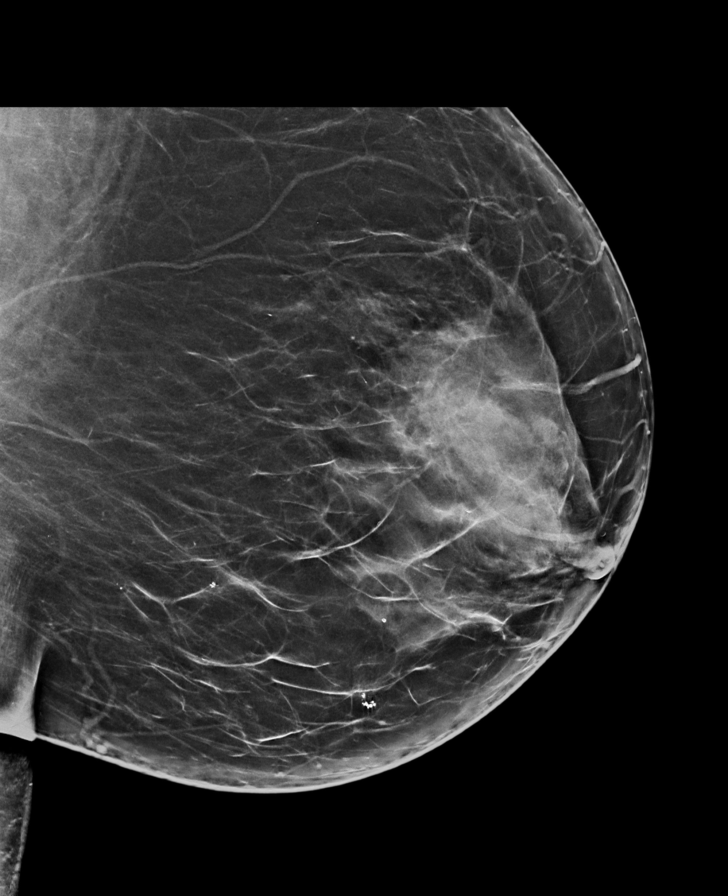

[L CC synth-2D]
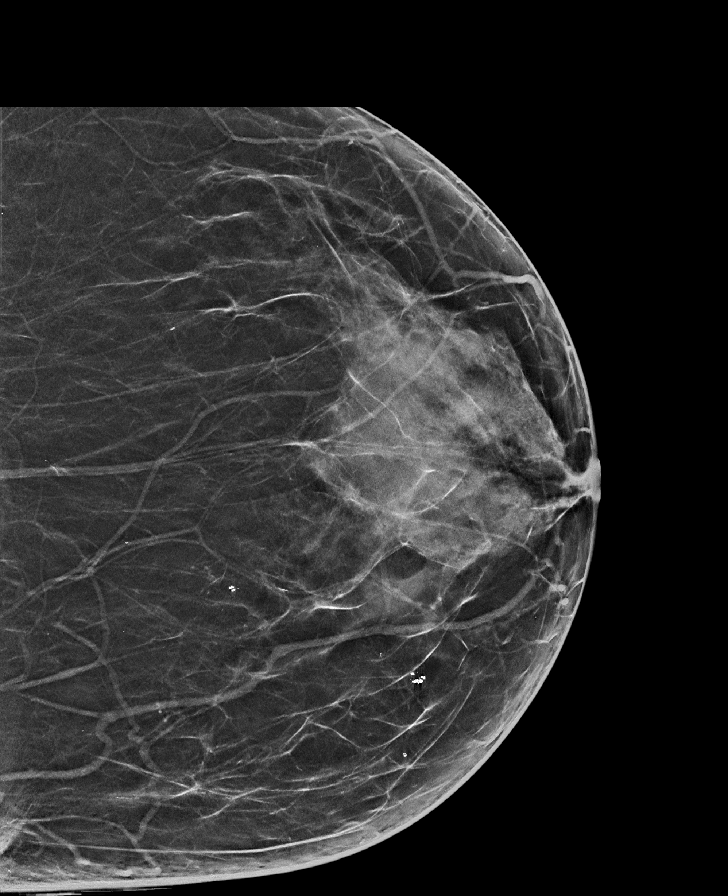

[L CC]
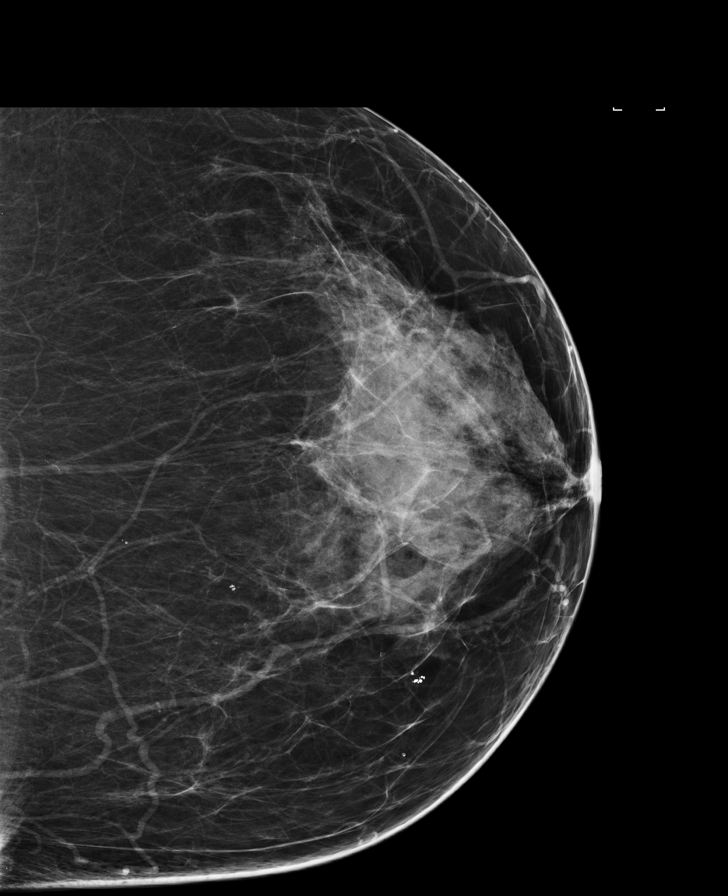

[8 of 31 positions shown; findings below may reference images not displayed]

ACR Breast Density Category c: The breast tissue is heterogeneously
dense, which may obscure small masses.
FINDINGS: There are no findings suspicious for malignancy. Images were
processed with CAD.
IMPRESSION: No mammographic evidence of malignancy. A result letter of this
screening mammogram will be mailed directly to the patient.

RECOMMENDATION:
Screening mammogram in one year. (Code:[id number])

BI-RADS CATEGORY  1: Negative.

## 2017-04-17 ENCOUNTER — Other Ambulatory Visit: Payer: Self-pay | Admitting: Neurosurgery

## 2017-04-17 DIAGNOSIS — M5416 Radiculopathy, lumbar region: Secondary | ICD-10-CM

## 2017-04-23 ENCOUNTER — Ambulatory Visit (INDEPENDENT_AMBULATORY_CARE_PROVIDER_SITE_OTHER): Payer: Medicare Other | Admitting: Family Medicine

## 2017-04-23 ENCOUNTER — Encounter: Payer: Self-pay | Admitting: Family Medicine

## 2017-04-23 VITALS — BP 120/86 | HR 57 | Temp 98.6°F | Resp 16 | Wt 248.0 lb

## 2017-04-23 DIAGNOSIS — E785 Hyperlipidemia, unspecified: Secondary | ICD-10-CM | POA: Diagnosis not present

## 2017-04-23 DIAGNOSIS — I10 Essential (primary) hypertension: Secondary | ICD-10-CM

## 2017-04-23 DIAGNOSIS — E119 Type 2 diabetes mellitus without complications: Secondary | ICD-10-CM | POA: Diagnosis not present

## 2017-04-23 LAB — LIPID PANEL
Cholesterol: 192 mg/dL (ref <200)
HDL: 78 mg/dL (ref >50)
LDL CHOLESTEROL (CALC): 95 mg/dL
NON-HDL CHOLESTEROL (CALC): 114 mg/dL (ref <130)
Total CHOL/HDL Ratio: 2.5 (calc) (ref <5.0)
Triglycerides: 95 mg/dL (ref <150)

## 2017-04-23 LAB — RENAL FUNCTION PANEL
Albumin: 3.9 g/dL (ref 3.6–5.1)
BUN / CREAT RATIO: 17 (calc) (ref 6–22)
BUN: 21 mg/dL (ref 7–25)
CALCIUM: 9.6 mg/dL (ref 8.6–10.4)
CO2: 29 mmol/L (ref 20–32)
Chloride: 106 mmol/L (ref 98–110)
Creat: 1.24 mg/dL — ABNORMAL HIGH (ref 0.60–0.93)
Glucose, Bld: 89 mg/dL (ref 65–99)
POTASSIUM: 4.2 mmol/L (ref 3.5–5.3)
Phosphorus: 3.8 mg/dL (ref 2.1–4.3)
SODIUM: 143 mmol/L (ref 135–146)

## 2017-04-23 LAB — POCT GLYCOSYLATED HEMOGLOBIN (HGB A1C)
ESTIMATED AVERAGE GLUCOSE: 114
HEMOGLOBIN A1C: 5.6

## 2017-04-23 MED ORDER — METFORMIN HCL 500 MG PO TABS: 500.0000 mg | ORAL_TABLET | Freq: Every day | ORAL | 0 refills | Status: DC

## 2017-04-23 NOTE — Progress Notes (Signed)
Patient: Suzanne Faulkner Female    DOB: [birth year]   76 y.o.   MRN: [id number] Visit Date: 04/23/2017  Today's Provider: Lelon Huh, MD   Chief Complaint  Patient presents with  . Hypertension    follow up  . Diabetes    follow up  . Hyperlipidemia    follow up   Subjective:    HPI  Hypertension, follow-up:  BP Readings from Last 3 Encounters:  03/15/17 (!) 144/84  01/04/17 140/75  11/12/16 (!) 148/82    She was last seen for hypertension 6 months ago.  BP at that visit was 130/72. Management since that visit includes no changes. She reports good compliance with treatment. She is not having side effects.  She is not exercising. She is adherent to low salt diet.   Outside blood pressures are not being checked. She is experiencing none.  Patient denies chest pain, chest pressure/discomfort, claudication, dyspnea, exertional chest pressure/discomfort, fatigue, irregular heart beat, lower extremity edema, near-syncope, orthopnea, palpitations, paroxysmal nocturnal dyspnea, syncope and tachypnea.   Cardiovascular risk factors include advanced age (older than 24 for men, 74 for women), diabetes mellitus, dyslipidemia and hypertension.  Use of agents associated with hypertension: NSAIDS.     Weight trend: fluctuating a bit Wt Readings from Last 3 Encounters:  01/04/17 259 lb (117.5 kg)  10/22/16 257 lb (116.6 kg)  08/19/16 258 lb (117 kg)    Current diet: in general, an "unhealthy" diet  ------------------------------------------------------------------------  Diabetes Mellitus Type II, Follow-up:   Lab Results  Component Value Date   HGBA1C 6.0 10/22/2016   HGBA1C 5.9 04/30/2016   HGBA1C 6.0 (H) 03/19/2016    Last seen for diabetes 6 months ago.  Management since then includes no changes. She reports good compliance with treatment. She is not having side effects.  Current symptoms include none and have been stable. Home blood sugar records: blood  sugars are not being checked regularly  Episodes of hypoglycemia? no   Current Insulin Regimen: none Most Recent Eye Exam: <1 year ago Weight trend: fluctuating a bit Prior visit with dietician: no Current diet: in general, an "unhealthy" diet Current exercise: none  Pertinent Labs:    Component Value Date/Time   CHOL 132 05/05/2016 0920   TRIG 101 05/05/2016 0920   HDL 61 05/05/2016 0920   LDLCALC 51 05/05/2016 0920   CREATININE 1.43 (H) 05/26/2016 1610    Wt Readings from Last 3 Encounters:  01/04/17 259 lb (117.5 kg)  10/22/16 257 lb (116.6 kg)  08/19/16 258 lb (117 kg)    ------------------------------------------------------------------------  Lipid/Cholesterol, Follow-up:   Last seen for this6 months ago.  Management changes since that visit include none. . Last Lipid Panel:    Component Value Date/Time   CHOL 132 05/05/2016 0920   TRIG 101 05/05/2016 0920   HDL 61 05/05/2016 0920   CHOLHDL 2.2 05/05/2016 0920   CHOLHDL 2.9 01/26/2013 0543   VLDL 25 01/26/2013 0543   LDLCALC 51 05/05/2016 0920    Risk factors for vascular disease include diabetes mellitus, hypercholesterolemia and hypertension  She reports good compliance with treatment. She is not having side effects.  Current symptoms include none and have been stable. Weight trend: fluctuating a bit Prior visit with dietician: no Current diet: in general, an "unhealthy" diet Current exercise: none  Wt Readings from Last 3 Encounters:  01/04/17 259 lb (117.5 kg)  10/22/16 257 lb (116.6 kg)  08/19/16 258 lb (117 kg)    -------------------------------------------------------------------  No Known Allergies   Current Outpatient Prescriptions:  .  Ascorbic Acid (VITAMIN C PO), Take 1 tablet by mouth every morning., Disp: , Rfl:  .  aspirin 325 MG tablet, Take 325 mg by mouth daily., Disp: , Rfl:  .  b complex vitamins tablet, Take 1 tablet by mouth every morning. , Disp: , Rfl:  .   benazepril (LOTENSIN) 10 MG tablet, TAKE ONE TABLET BY MOUTH ONCE DAILY, Disp: 90 tablet, Rfl: 4 .  Calcium Carbonate-Vitamin D (CALCIUM + D PO), Take 1 tablet by mouth every morning. , Disp: , Rfl:  .  hydrochlorothiazide (MICROZIDE) 12.5 MG capsule, TAKE ONE CAPSULE BY MOUTH ONCE DAILY, Disp: 180 capsule, Rfl: 4 .  lovastatin (MEVACOR) 20 MG tablet, TAKE ONE TABLET BY MOUTH AT BEDTIME, Disp: 90 tablet, Rfl: 4 .  metFORMIN (GLUCOPHAGE) 500 MG tablet, TAKE ONE TABLET BY MOUTH TWICE DAILY, Disp: 180 tablet, Rfl: 3  Review of Systems  Constitutional: Negative for appetite change, chills, fatigue and fever.  Respiratory: Negative for chest tightness and shortness of breath.   Cardiovascular: Negative for chest pain and palpitations.  Gastrointestinal: Negative for abdominal pain, nausea and vomiting.  Neurological: Negative for dizziness and weakness.    Social History  Substance Use Topics  . Smoking status: Former Smoker    Packs/day: 0.75    Years: 47.00    Types: Cigarettes    Quit date: 01/08/2014  . Smokeless tobacco: Never Used     Comment: Smoked 1/2-1 ppd since age 53 Quit May 2015  . Alcohol use No   Objective:    Vitals:   04/23/17 1121  BP: 120/86  Pulse: (!) 57  Resp: 16  Temp: 98.6 F (37 C)  TempSrc: Oral  SpO2: 98%  Weight: 248 lb (112.5 kg)     Physical Exam   General Appearance:    Alert, cooperative, no distress, obese  Eyes:    PERRL, conjunctiva/corneas clear, EOM's intact       Lungs:     Clear to auscultation bilaterally, respirations unlabored  Heart:    Regular rate and rhythm  Neurologic:   Awake, alert, oriented x 3. No apparent focal neurological           defect.         Results for orders placed or performed in visit on 04/23/17  POCT HgB A1C  Result Value Ref Range   Hemoglobin A1C 5.6    Est. average glucose Bld gHb Est-mCnc 114        Assessment & Plan:     1. Type 2 diabetes mellitus without complication, without long-term  current use of insulin (HCC) Much better. Will reduce metformin from BID to QD - POCT HgB A1C - metFORMIN (GLUCOPHAGE) 500 MG tablet; Take 1 tablet (500 mg total) by mouth daily.  Dispense: 3 tablet; Refill: 0 - Lipid panel - POCT UA - Microalbumin  2. Essential hypertension Well controlled.  Continue current medications.   - Lipid panel  3. Hyperlipidemia, unspecified hyperlipidemia type She is tolerating lovastatin well with no adverse effects.   - Renal function panel - Lipid panel  Return in about 3 weeks (around 05/14/2017) for for flu vaccine only, 4 months for diabetes.       Lelon Huh, MD  [facility]

## 2017-04-24 ENCOUNTER — Ambulatory Visit: Payer: Medicare Other | Admitting: Family Medicine

## 2017-04-29 ENCOUNTER — Ambulatory Visit
Admission: RE | Admit: 2017-04-29 | Discharge: 2017-04-29 | Disposition: A | Discharge status: Home / Self Care | Payer: Medicare Other | Source: Ambulatory Visit | Attending: Neurosurgery | Admitting: Neurosurgery

## 2017-04-29 VITALS — BP 148/95 | HR 62

## 2017-04-29 DIAGNOSIS — M545 Low back pain, unspecified: Secondary | ICD-10-CM

## 2017-04-29 DIAGNOSIS — M5416 Radiculopathy, lumbar region: Secondary | ICD-10-CM

## 2017-04-29 DIAGNOSIS — M48061 Spinal stenosis, lumbar region without neurogenic claudication: Secondary | ICD-10-CM | POA: Diagnosis not present

## 2017-04-29 DIAGNOSIS — M79605 Pain in left leg: Secondary | ICD-10-CM

## 2017-04-29 MED ORDER — ONDANSETRON HCL 4 MG/2ML IJ SOLN
4.0000 mg | Freq: Once | INTRAMUSCULAR | Status: AC
  Administered 2017-04-29: 4 mg via INTRAMUSCULAR

## 2017-04-29 MED ORDER — MEPERIDINE HCL 100 MG/ML IJ SOLN
75.0000 mg | Freq: Once | INTRAMUSCULAR | Status: AC
  Administered 2017-04-29: 75 mg via INTRAMUSCULAR

## 2017-04-29 MED ORDER — IOPAMIDOL (ISOVUE-M 200) INJECTION 41%
15.0000 mL | Freq: Once | INTRAMUSCULAR | Status: AC
  Administered 2017-04-29: 15 mL via INTRATHECAL

## 2017-04-29 MED ORDER — DIAZEPAM 5 MG PO TABS
5.0000 mg | ORAL_TABLET | Freq: Once | ORAL | Status: AC
  Administered 2017-04-29: 5 mg via ORAL

## 2017-04-29 NOTE — Discharge Instructions (Signed)

## 2017-05-02 ENCOUNTER — Ambulatory Visit (INDEPENDENT_AMBULATORY_CARE_PROVIDER_SITE_OTHER): Payer: Medicare Other

## 2017-05-02 DIAGNOSIS — Z23 Encounter for immunization: Secondary | ICD-10-CM | POA: Diagnosis not present

## 2017-05-04 LAB — POCT UA - MICROALBUMIN: Microalbumin Ur, POC: 20 mg/L

## 2017-06-01 DIAGNOSIS — Z9689 Presence of other specified functional implants: Secondary | ICD-10-CM | POA: Diagnosis not present

## 2017-06-01 DIAGNOSIS — M545 Low back pain: Secondary | ICD-10-CM | POA: Diagnosis not present

## 2017-06-01 DIAGNOSIS — M5416 Radiculopathy, lumbar region: Secondary | ICD-10-CM | POA: Diagnosis not present

## 2017-06-16 ENCOUNTER — Other Ambulatory Visit: Payer: Self-pay | Admitting: Neurosurgery

## 2017-06-16 DIAGNOSIS — S12490A Other displaced fracture of fifth cervical vertebra, initial encounter for closed fracture: Secondary | ICD-10-CM | POA: Diagnosis not present

## 2017-06-16 DIAGNOSIS — M48061 Spinal stenosis, lumbar region without neurogenic claudication: Secondary | ICD-10-CM | POA: Diagnosis not present

## 2017-06-18 ENCOUNTER — Encounter (HOSPITAL_COMMUNITY): Payer: Self-pay | Admitting: *Deleted

## 2017-06-18 ENCOUNTER — Other Ambulatory Visit: Payer: Self-pay

## 2017-06-18 NOTE — Progress Notes (Signed)
Suzanne Faulkner has a history of DM, she takes Metformin.  Patient does not check CBG.  Last A1C was 5.6 - 04/27/17 .  I instructed patient to not take any medications in am.  I instructed patient to check CBG after awaking and every 2 hours until arrival  to the hospital.  I Instructed patient if CBG is less than 70 to take 1/2 cup of a clear juice. Recheck CBG in 15 minutes then call pre- op desk at [phone] for further instructions.

## 2017-06-19 ENCOUNTER — Ambulatory Visit (HOSPITAL_COMMUNITY): Payer: Medicare Other

## 2017-06-19 ENCOUNTER — Ambulatory Visit (HOSPITAL_COMMUNITY): Payer: Medicare Other | Admitting: Certified Registered Nurse Anesthetist

## 2017-06-19 ENCOUNTER — Observation Stay (HOSPITAL_COMMUNITY)
Admission: RE | Admit: 2017-06-19 | Discharge: 2017-06-20 | Disposition: A | Discharge status: Home / Self Care | Payer: Medicare Other | Source: Ambulatory Visit | Attending: Neurosurgery | Admitting: Neurosurgery

## 2017-06-19 ENCOUNTER — Encounter (HOSPITAL_COMMUNITY): Payer: Self-pay | Admitting: Orthopedic Surgery

## 2017-06-19 ENCOUNTER — Encounter (HOSPITAL_COMMUNITY)
Admission: RE | Disposition: A | Discharge status: Home / Self Care | Payer: Self-pay | Source: Ambulatory Visit | Attending: Neurosurgery

## 2017-06-19 DIAGNOSIS — Z6839 Body mass index (BMI) 39.0-39.9, adult: Secondary | ICD-10-CM | POA: Diagnosis not present

## 2017-06-19 DIAGNOSIS — Z79899 Other long term (current) drug therapy: Secondary | ICD-10-CM | POA: Insufficient documentation

## 2017-06-19 DIAGNOSIS — M48062 Spinal stenosis, lumbar region with neurogenic claudication: Secondary | ICD-10-CM | POA: Diagnosis not present

## 2017-06-19 DIAGNOSIS — E119 Type 2 diabetes mellitus without complications: Secondary | ICD-10-CM | POA: Diagnosis not present

## 2017-06-19 DIAGNOSIS — Z90710 Acquired absence of both cervix and uterus: Secondary | ICD-10-CM | POA: Insufficient documentation

## 2017-06-19 DIAGNOSIS — Z87891 Personal history of nicotine dependence: Secondary | ICD-10-CM | POA: Diagnosis not present

## 2017-06-19 DIAGNOSIS — I10 Essential (primary) hypertension: Secondary | ICD-10-CM | POA: Diagnosis not present

## 2017-06-19 DIAGNOSIS — E669 Obesity, unspecified: Secondary | ICD-10-CM | POA: Insufficient documentation

## 2017-06-19 DIAGNOSIS — Z7982 Long term (current) use of aspirin: Secondary | ICD-10-CM | POA: Diagnosis not present

## 2017-06-19 DIAGNOSIS — M1990 Unspecified osteoarthritis, unspecified site: Secondary | ICD-10-CM | POA: Diagnosis not present

## 2017-06-19 DIAGNOSIS — Z86010 Personal history of colonic polyps: Secondary | ICD-10-CM | POA: Insufficient documentation

## 2017-06-19 DIAGNOSIS — Z9689 Presence of other specified functional implants: Secondary | ICD-10-CM | POA: Diagnosis not present

## 2017-06-19 DIAGNOSIS — M4856XA Collapsed vertebra, not elsewhere classified, lumbar region, initial encounter for fracture: Secondary | ICD-10-CM | POA: Insufficient documentation

## 2017-06-19 DIAGNOSIS — E785 Hyperlipidemia, unspecified: Secondary | ICD-10-CM | POA: Diagnosis not present

## 2017-06-19 DIAGNOSIS — M48061 Spinal stenosis, lumbar region without neurogenic claudication: Secondary | ICD-10-CM | POA: Diagnosis not present

## 2017-06-19 DIAGNOSIS — Z87442 Personal history of urinary calculi: Secondary | ICD-10-CM | POA: Diagnosis not present

## 2017-06-19 DIAGNOSIS — Z7984 Long term (current) use of oral hypoglycemic drugs: Secondary | ICD-10-CM | POA: Diagnosis not present

## 2017-06-19 DIAGNOSIS — S32010A Wedge compression fracture of first lumbar vertebra, initial encounter for closed fracture: Secondary | ICD-10-CM | POA: Diagnosis not present

## 2017-06-19 HISTORY — PX: LUMBAR LAMINECTOMY/DECOMPRESSION MICRODISCECTOMY: SHX5026

## 2017-06-19 LAB — BASIC METABOLIC PANEL
Anion gap: 9 (ref 5–15)
BUN: 13 mg/dL (ref 6–20)
CALCIUM: 8.9 mg/dL (ref 8.9–10.3)
CO2: 24 mmol/L (ref 22–32)
CREATININE: 1.43 mg/dL — AB (ref 0.44–1.00)
Chloride: 108 mmol/L (ref 101–111)
GFR calc Af Amer: 40 mL/min — ABNORMAL LOW (ref >60)
GFR calc non Af Amer: 35 mL/min — ABNORMAL LOW (ref >60)
GLUCOSE: 114 mg/dL — AB (ref 65–99)
Potassium: 3.9 mmol/L (ref 3.5–5.1)
Sodium: 141 mmol/L (ref 135–145)

## 2017-06-19 LAB — CBC
HCT: 41.4 % (ref 36.0–46.0)
Hemoglobin: 13.1 g/dL (ref 12.0–15.0)
MCH: 30.6 pg (ref 26.0–34.0)
MCHC: 31.6 g/dL (ref 30.0–36.0)
MCV: 96.7 fL (ref 78.0–100.0)
PLATELETS: 254 10*3/uL (ref 150–400)
RBC: 4.28 MIL/uL (ref 3.87–5.11)
RDW: 12.9 % (ref 11.5–15.5)
WBC: 5.1 10*3/uL (ref 4.0–10.5)

## 2017-06-19 LAB — GLUCOSE, CAPILLARY
GLUCOSE-CAPILLARY: 106 mg/dL — AB (ref 65–99)
GLUCOSE-CAPILLARY: 104 mg/dL — AB (ref 65–99)
GLUCOSE-CAPILLARY: 206 mg/dL — AB (ref 65–99)
GLUCOSE-CAPILLARY: 138 mg/dL — AB (ref 65–99)

## 2017-06-19 LAB — SURGICAL PCR SCREEN
MRSA, PCR: NEGATIVE
STAPHYLOCOCCUS AUREUS: NEGATIVE

## 2017-06-19 SURGERY — LUMBAR LAMINECTOMY/DECOMPRESSION MICRODISCECTOMY 1 LEVEL
Anesthesia: General

## 2017-06-19 MED ORDER — VITAMIN C 500 MG PO TABS
500.0000 mg | ORAL_TABLET | Freq: Every morning | ORAL | Status: DC
  Administered 2017-06-20: 500 mg via ORAL
  Filled 2017-06-19: qty 1

## 2017-06-19 MED ORDER — HEMOSTATIC AGENTS (NO CHARGE) OPTIME
TOPICAL | Status: DC | PRN
  Administered 2017-06-19: 1 via TOPICAL

## 2017-06-19 MED ORDER — HYDROCHLOROTHIAZIDE 12.5 MG PO CAPS
12.5000 mg | ORAL_CAPSULE | Freq: Every day | ORAL | Status: DC
  Administered 2017-06-20: 12.5 mg via ORAL
  Filled 2017-06-19: qty 1

## 2017-06-19 MED ORDER — OXYCODONE HCL 5 MG PO TABS: 10.0000 mg | ORAL_TABLET | ORAL | Status: DC | PRN

## 2017-06-19 MED ORDER — MIDAZOLAM HCL 2 MG/2ML IJ SOLN
INTRAMUSCULAR | Status: DC | PRN
  Administered 2017-06-19 (×2): 1 mg via INTRAVENOUS

## 2017-06-19 MED ORDER — ONDANSETRON HCL 4 MG/2ML IJ SOLN
INTRAMUSCULAR | Status: DC | PRN
  Administered 2017-06-19: 4 mg via INTRAVENOUS

## 2017-06-19 MED ORDER — PHENYLEPHRINE 40 MCG/ML (10ML) SYRINGE FOR IV PUSH (FOR BLOOD PRESSURE SUPPORT)
PREFILLED_SYRINGE | INTRAVENOUS | Status: DC | PRN
  Administered 2017-06-19: 40 ug via INTRAVENOUS

## 2017-06-19 MED ORDER — HYDROMORPHONE HCL 1 MG/ML IJ SOLN: 0.2500 mg | INTRAMUSCULAR | Status: DC | PRN

## 2017-06-19 MED ORDER — LIDOCAINE-EPINEPHRINE 0.5 %-1:200000 IJ SOLN
INTRAMUSCULAR | Status: DC | PRN
  Administered 2017-06-19: 10 mg

## 2017-06-19 MED ORDER — GABAPENTIN 300 MG PO CAPS
300.0000 mg | ORAL_CAPSULE | Freq: Three times a day (TID) | ORAL | Status: DC
Start: 2017-06-19 — End: 2017-06-20
  Administered 2017-06-19 (×2): 300 mg via ORAL
  Filled 2017-06-19 (×3): qty 1

## 2017-06-19 MED ORDER — LIDOCAINE-EPINEPHRINE 0.5 %-1:200000 IJ SOLN
INTRAMUSCULAR | Status: AC
  Filled 2017-06-19: qty 1

## 2017-06-19 MED ORDER — OXYCODONE HCL ER 10 MG PO T12A
10.0000 mg | EXTENDED_RELEASE_TABLET | Freq: Two times a day (BID) | ORAL | Status: DC
  Administered 2017-06-19 – 2017-06-20 (×3): 10 mg via ORAL
  Filled 2017-06-19 (×3): qty 1

## 2017-06-19 MED ORDER — CHLORHEXIDINE GLUCONATE CLOTH 2 % EX PADS: 6.0000 | MEDICATED_PAD | Freq: Once | CUTANEOUS | Status: DC

## 2017-06-19 MED ORDER — SODIUM CHLORIDE 0.9% FLUSH
3.0000 mL | Freq: Two times a day (BID) | INTRAVENOUS | Status: DC
  Administered 2017-06-19: 3 mL via INTRAVENOUS

## 2017-06-19 MED ORDER — ZOLPIDEM TARTRATE 5 MG PO TABS: 5.0000 mg | ORAL_TABLET | Freq: Every evening | ORAL | Status: DC | PRN

## 2017-06-19 MED ORDER — B COMPLEX-C PO TABS
1.0000 | ORAL_TABLET | Freq: Every day | ORAL | Status: DC
  Administered 2017-06-20: 1 via ORAL
  Filled 2017-06-19: qty 1

## 2017-06-19 MED ORDER — MENTHOL 3 MG MT LOZG: 1.0000 | LOZENGE | OROMUCOSAL | Status: DC | PRN

## 2017-06-19 MED ORDER — DEXAMETHASONE SODIUM PHOSPHATE 10 MG/ML IJ SOLN
INTRAMUSCULAR | Status: DC | PRN
  Administered 2017-06-19: 5 mg via INTRAVENOUS

## 2017-06-19 MED ORDER — LACTATED RINGERS IV SOLN
INTRAVENOUS | Status: DC
  Administered 2017-06-19 (×2): via INTRAVENOUS

## 2017-06-19 MED ORDER — 0.9 % SODIUM CHLORIDE (POUR BTL) OPTIME
TOPICAL | Status: DC | PRN
  Administered 2017-06-19: 1000 mL

## 2017-06-19 MED ORDER — SODIUM CHLORIDE 0.9 % IV SOLN: 250.0000 mL | INTRAVENOUS | Status: DC

## 2017-06-19 MED ORDER — LIDOCAINE 2% (20 MG/ML) 5 ML SYRINGE
INTRAMUSCULAR | Status: DC | PRN
  Administered 2017-06-19: 80 mg via INTRAVENOUS

## 2017-06-19 MED ORDER — BISACODYL 5 MG PO TBEC: 5.0000 mg | DELAYED_RELEASE_TABLET | Freq: Every day | ORAL | Status: DC | PRN

## 2017-06-19 MED ORDER — ONDANSETRON HCL 4 MG/2ML IJ SOLN
4.0000 mg | Freq: Four times a day (QID) | INTRAMUSCULAR | Status: DC | PRN
Start: 2017-06-19 — End: 2017-06-20

## 2017-06-19 MED ORDER — SUGAMMADEX SODIUM 200 MG/2ML IV SOLN
INTRAVENOUS | Status: DC | PRN
  Administered 2017-06-19: 300 mg via INTRAVENOUS

## 2017-06-19 MED ORDER — ROCURONIUM BROMIDE 10 MG/ML (PF) SYRINGE
PREFILLED_SYRINGE | INTRAVENOUS | Status: DC | PRN
  Administered 2017-06-19: 50 mg via INTRAVENOUS

## 2017-06-19 MED ORDER — MORPHINE SULFATE (PF) 4 MG/ML IV SOLN: 2.0000 mg | INTRAVENOUS | Status: DC | PRN

## 2017-06-19 MED ORDER — ONDANSETRON HCL 4 MG PO TABS: 4.0000 mg | ORAL_TABLET | Freq: Four times a day (QID) | ORAL | Status: DC | PRN

## 2017-06-19 MED ORDER — CEFAZOLIN SODIUM-DEXTROSE 2-4 GM/100ML-% IV SOLN
2.0000 g | INTRAVENOUS | Status: AC
  Administered 2017-06-19: 2 g via INTRAVENOUS
  Filled 2017-06-19: qty 100

## 2017-06-19 MED ORDER — CELECOXIB 200 MG PO CAPS
200.0000 mg | ORAL_CAPSULE | Freq: Two times a day (BID) | ORAL | Status: DC
  Administered 2017-06-19 – 2017-06-20 (×2): 200 mg via ORAL
  Filled 2017-06-19 (×2): qty 1

## 2017-06-19 MED ORDER — PHENYLEPHRINE HCL 10 MG/ML IJ SOLN
INTRAMUSCULAR | Status: DC | PRN
  Administered 2017-06-19: 50 ug/min via INTRAVENOUS

## 2017-06-19 MED ORDER — ACETAMINOPHEN 325 MG PO TABS: 650.0000 mg | ORAL_TABLET | ORAL | Status: DC | PRN

## 2017-06-19 MED ORDER — THROMBIN (RECOMBINANT) 5000 UNITS EX SOLR
CUTANEOUS | Status: AC
  Filled 2017-06-19: qty 10000

## 2017-06-19 MED ORDER — OXYCODONE HCL 5 MG PO TABS: 5.0000 mg | ORAL_TABLET | ORAL | Status: DC | PRN

## 2017-06-19 MED ORDER — PROMETHAZINE HCL 25 MG/ML IJ SOLN: 6.2500 mg | INTRAMUSCULAR | Status: DC | PRN

## 2017-06-19 MED ORDER — SODIUM CHLORIDE 0.9% FLUSH: 3.0000 mL | INTRAVENOUS | Status: DC | PRN

## 2017-06-19 MED ORDER — CALCIUM CITRATE-VITAMIN D 315-200 MG-UNIT PO TABS: 1.0000 | ORAL_TABLET | Freq: Every morning | ORAL | Status: DC

## 2017-06-19 MED ORDER — MIDAZOLAM HCL 2 MG/2ML IJ SOLN
INTRAMUSCULAR | Status: AC
  Filled 2017-06-19: qty 2

## 2017-06-19 MED ORDER — CALCIUM CARBONATE-VITAMIN D 500-200 MG-UNIT PO TABS
1.0000 | ORAL_TABLET | Freq: Every day | ORAL | Status: DC
  Administered 2017-06-20: 10:00:00 1 via ORAL
  Filled 2017-06-19: qty 1

## 2017-06-19 MED ORDER — FENTANYL CITRATE (PF) 250 MCG/5ML IJ SOLN
INTRAMUSCULAR | Status: DC | PRN
Start: 2017-06-19 — End: 2017-06-19
  Administered 2017-06-19 (×3): 50 ug via INTRAVENOUS

## 2017-06-19 MED ORDER — BENAZEPRIL HCL 20 MG PO TABS
10.0000 mg | ORAL_TABLET | Freq: Every day | ORAL | Status: DC
  Administered 2017-06-20: 10 mg via ORAL
  Filled 2017-06-19: qty 1

## 2017-06-19 MED ORDER — B COMPLEX PO TABS: 1.0000 | ORAL_TABLET | Freq: Every morning | ORAL | Status: DC

## 2017-06-19 MED ORDER — PROPOFOL 10 MG/ML IV BOLUS
INTRAVENOUS | Status: DC | PRN
  Administered 2017-06-19: 20 mg via INTRAVENOUS
  Administered 2017-06-19: 150 mg via INTRAVENOUS
  Administered 2017-06-19: 10 mg via INTRAVENOUS

## 2017-06-19 MED ORDER — POTASSIUM CHLORIDE IN NACL 20-0.9 MEQ/L-% IV SOLN
INTRAVENOUS | Status: DC
  Administered 2017-06-19: 17:00:00 via INTRAVENOUS
  Filled 2017-06-19: qty 1000

## 2017-06-19 MED ORDER — DOCUSATE SODIUM 100 MG PO CAPS
100.0000 mg | ORAL_CAPSULE | Freq: Two times a day (BID) | ORAL | Status: DC
  Administered 2017-06-19 – 2017-06-20 (×3): 100 mg via ORAL
  Filled 2017-06-19 (×3): qty 1

## 2017-06-19 MED ORDER — DIAZEPAM 5 MG PO TABS: 5.0000 mg | ORAL_TABLET | Freq: Four times a day (QID) | ORAL | Status: DC | PRN

## 2017-06-19 MED ORDER — PRAVASTATIN SODIUM 20 MG PO TABS
20.0000 mg | ORAL_TABLET | Freq: Every day | ORAL | Status: DC
  Administered 2017-06-19: 20 mg via ORAL
  Filled 2017-06-19: qty 1

## 2017-06-19 MED ORDER — METFORMIN HCL 500 MG PO TABS
500.0000 mg | ORAL_TABLET | Freq: Every day | ORAL | Status: DC
  Administered 2017-06-19 – 2017-06-20 (×2): 500 mg via ORAL
  Filled 2017-06-19 (×2): qty 1

## 2017-06-19 MED ORDER — PHENOL 1.4 % MT LIQD: 1.0000 | OROMUCOSAL | Status: DC | PRN

## 2017-06-19 MED ORDER — PROPOFOL 10 MG/ML IV BOLUS
INTRAVENOUS | Status: AC
  Filled 2017-06-19: qty 20

## 2017-06-19 MED ORDER — IOPAMIDOL (ISOVUE-300) INJECTION 61%
INTRAVENOUS | Status: AC
  Filled 2017-06-19: qty 50

## 2017-06-19 MED ORDER — ACETAMINOPHEN 650 MG RE SUPP: 650.0000 mg | RECTAL | Status: DC | PRN

## 2017-06-19 MED ORDER — SENNOSIDES-DOCUSATE SODIUM 8.6-50 MG PO TABS: 1.0000 | ORAL_TABLET | Freq: Every evening | ORAL | Status: DC | PRN

## 2017-06-19 MED ORDER — THROMBIN (RECOMBINANT) 5000 UNITS EX SOLR
CUTANEOUS | Status: DC | PRN
  Administered 2017-06-19 (×2): 5000 [IU] via TOPICAL

## 2017-06-19 MED ORDER — FENTANYL CITRATE (PF) 250 MCG/5ML IJ SOLN
INTRAMUSCULAR | Status: AC
  Filled 2017-06-19: qty 5

## 2017-06-19 SURGICAL SUPPLY — 55 items
BAG DECANTER FOR FLEXI CONT (MISCELLANEOUS) IMPLANT
BENZOIN TINCTURE PRP APPL 2/3 (GAUZE/BANDAGES/DRESSINGS) IMPLANT
BLADE CLIPPER SURG (BLADE) IMPLANT
BUR MATCHSTICK NEURO 3.0 LAGG (BURR) ×2 IMPLANT
BUR PRECISION FLUTE 5.0 (BURR) IMPLANT
CANISTER SUCT 3000ML PPV (MISCELLANEOUS) ×2 IMPLANT
CARTRIDGE OIL MAESTRO DRILL (MISCELLANEOUS) ×1 IMPLANT
CEMENT BONE KYPHX HV R (Orthopedic Implant) ×2 IMPLANT
CEMENT KYPHON C01A KIT/MIXER (Cement) ×2 IMPLANT
DECANTER SPIKE VIAL GLASS SM (MISCELLANEOUS) ×2 IMPLANT
DERMABOND ADVANCED (GAUZE/BANDAGES/DRESSINGS) ×1
DERMABOND ADVANCED .7 DNX12 (GAUZE/BANDAGES/DRESSINGS) ×1 IMPLANT
DIFFUSER DRILL AIR PNEUMATIC (MISCELLANEOUS) ×2 IMPLANT
DRAPE C-ARMOR (DRAPES) ×2 IMPLANT
DRAPE LAPAROTOMY 100X72X124 (DRAPES) ×2 IMPLANT
DRAPE MICROSCOPE LEICA (MISCELLANEOUS) IMPLANT
DRAPE POUCH INSTRU U-SHP 10X18 (DRAPES) ×2 IMPLANT
DRAPE SURG 17X23 STRL (DRAPES) ×2 IMPLANT
DRAPE WARM FLUID 44X44 (DRAPE) ×2 IMPLANT
DURAPREP 26ML APPLICATOR (WOUND CARE) ×2 IMPLANT
ELECT BLADE 4.0 EZ CLEAN MEGAD (MISCELLANEOUS) ×2
ELECT REM PT RETURN 9FT ADLT (ELECTROSURGICAL) ×2
ELECTRODE BLDE 4.0 EZ CLN MEGD (MISCELLANEOUS) ×1 IMPLANT
ELECTRODE REM PT RTRN 9FT ADLT (ELECTROSURGICAL) ×1 IMPLANT
GAUZE SPONGE 4X4 12PLY STRL (GAUZE/BANDAGES/DRESSINGS) IMPLANT
GAUZE SPONGE 4X4 16PLY XRAY LF (GAUZE/BANDAGES/DRESSINGS) IMPLANT
GLOVE ECLIPSE 6.5 STRL STRAW (GLOVE) ×2 IMPLANT
GLOVE EXAM NITRILE LRG STRL (GLOVE) IMPLANT
GLOVE EXAM NITRILE XL STR (GLOVE) IMPLANT
GLOVE EXAM NITRILE XS STR PU (GLOVE) IMPLANT
GOWN STRL REUS W/ TWL LRG LVL3 (GOWN DISPOSABLE) ×2 IMPLANT
GOWN STRL REUS W/ TWL XL LVL3 (GOWN DISPOSABLE) IMPLANT
GOWN STRL REUS W/TWL 2XL LVL3 (GOWN DISPOSABLE) IMPLANT
GOWN STRL REUS W/TWL LRG LVL3 (GOWN DISPOSABLE) ×2
GOWN STRL REUS W/TWL XL LVL3 (GOWN DISPOSABLE)
KIT BASIN OR (CUSTOM PROCEDURE TRAY) ×2 IMPLANT
KIT ROOM TURNOVER OR (KITS) ×2 IMPLANT
NEEDLE HYPO 25X1 1.5 SAFETY (NEEDLE) ×2 IMPLANT
NEEDLE SPNL 18GX3.5 QUINCKE PK (NEEDLE) IMPLANT
NS IRRIG 1000ML POUR BTL (IV SOLUTION) ×2 IMPLANT
OIL CARTRIDGE MAESTRO DRILL (MISCELLANEOUS) ×2
PACK LAMINECTOMY NEURO (CUSTOM PROCEDURE TRAY) ×2 IMPLANT
PAD ARMBOARD 7.5X6 YLW CONV (MISCELLANEOUS) ×6 IMPLANT
RUBBERBAND STERILE (MISCELLANEOUS) IMPLANT
SPONGE LAP 4X18 X RAY DECT (DISPOSABLE) IMPLANT
SPONGE SURGIFOAM ABS GEL SZ50 (HEMOSTASIS) ×2 IMPLANT
STRIP CLOSURE SKIN 1/2X4 (GAUZE/BANDAGES/DRESSINGS) IMPLANT
SUT VIC AB 0 CT1 18XCR BRD8 (SUTURE) ×1 IMPLANT
SUT VIC AB 0 CT1 8-18 (SUTURE) ×1
SUT VIC AB 2-0 CT1 18 (SUTURE) ×2 IMPLANT
SUT VIC AB 3-0 SH 8-18 (SUTURE) ×4 IMPLANT
TOWEL GREEN STERILE (TOWEL DISPOSABLE) ×2 IMPLANT
TOWEL GREEN STERILE FF (TOWEL DISPOSABLE) ×2 IMPLANT
TRAY KYPHOPAK 20/3 ONESTEP 1ST (MISCELLANEOUS) ×2 IMPLANT
WATER STERILE IRR 1000ML POUR (IV SOLUTION) ×2 IMPLANT

## 2017-06-19 NOTE — Progress Notes (Signed)
Patient arrived to unit alert and oriented x 4, drowsy, delayed speech response. Patient and family oriented to unit, current treatment plan and medications explained, reviewed and need reinforcement. Call light and telephone placed within patients reach. RN will continue to monitor.

## 2017-06-19 NOTE — Progress Notes (Signed)
RN spoke with Dr. Alphonzo Dublin medical assistant at [facility] to inform him of patients drainage from surgical site. RN placed honeycomb dressing to mid lower back/surgical incision, new drainage marked. RN will continue to monitor.

## 2017-06-19 NOTE — Transfer of Care (Signed)
Immediate Anesthesia Transfer of Care Note  Patient: Suzanne Faulkner  Procedure(s) Performed: LUMBAR LAMINECTOMYLUMBAR 4- LUMBAR 5, KYPHOPLASTY LUMBAR 4- LUMBAR 5 (N/A )  Patient Location: PACU  Anesthesia Type:General  Level of Consciousness: awake and alert   Airway & Oxygen Therapy: Patient Spontanous Breathing and Patient connected to nasal cannula oxygen  Post-op Assessment: Report given to RN and Post -op Vital signs reviewed and stable  Post vital signs: Reviewed and stable  Last Vitals:  Vitals:   06/19/17 0749 06/19/17 1234  BP: 127/86 138/83  Pulse: (!) 59 (!) (P) 59  Resp: 18 12  Temp: 37.2 C 37.1 C  SpO2: 100%     Last Pain:  Vitals:   06/19/17 0749  TempSrc: Oral      Patients Stated Pain Goal: 3 (45/03/88 8280)  Complications: No apparent anesthesia complications

## 2017-06-19 NOTE — Op Note (Signed)
06/19/2017  12:54 PM  PATIENT:  Suzanne Faulkner  77 y.o. female with compression fractures at L4, and L5, along with severe stenosis at L4/5.  PRE-OPERATIVE DIAGNOSIS:  SPINAL STENOSIS OF LUMBAR REGION WITHOUT NEUROGENIC CLAUDICATION L4/5, L4, L5 acute compression fractures  POST-OPERATIVE DIAGNOSIS:  spinal stenosis lumbar spine without neurogenic claudication L4 compression fracture, acute  PROCEDURE:  Procedure(s): LUMBAR LAMINECTOMYLUMBAR 4- LUMBAR 5,  KYPHOPLASTY LUMBAR 4  SURGEON: Surgeon(s): Ashok Pall, MD Earnie Larsson, MD  ASSISTANTS:Pool, Mallie Mussel  ANESTHESIA:   general  EBL:  Total I/O In: 1500 [I.V.:1500] Out: 50 [Blood:50]  BLOOD ADMINISTERED:none  CELL SAVER GIVEN:none  COUNT:per nursing  DRAINS: none   SPECIMEN:  No Specimen  DICTATION: [name] was taken to the operating room, intubated, and placed under a general anesthetic without difficulty. She was positioned prone on the [employer] table with all pressure points properly padded. Her lumbar region was prepped and draped in a sterile manner. I opened the skin with a 10 blade in the midline and carried the incision down to the thoracolumbar lesion. I exposed the lamina of L4 and L5 bilaterally and confirmed my location with an intraoperative xray. I then used the Leksell rongeur, the drill, and Kerrison punches to remove most of the L4 lamina, and some of the L5 lamina. I used the Kerrison punches to decompress the L4 and L5 roots bilaterally. Dr. Annette Stable assisted with the decompression. The ligamentum flavum was removed with the Kerrison punches. Partial facetectomies L4/5, were performed bilaterally. With the decompression complete I moved on to the Kyphoplasty.  I cannulated the right L5 pedicle percutaneously with fluoroscopic guidance. I drilled into the body, but the bone was quite hard. I, after drilling tried to advance the balloon into the vertebral body, but was unable to advance it so that the balloon was not  within the metal sheath. Given that the bone was so hard, I felt it had healed. She is unable to receive an MRI due to a spinal cord stimulator.  I then moved to the L4 vertebral body. I cannulated both pedicles, used the balloon to perform kyphoplasties bilaterally . I then placed 4.60ml of methylmethacrylate on each side to get a good fill.  I closed the lumbar laminectomy wound in layers with vicryl sutures, approximating the thoracolumbar, subcutaneous, and subcuticular layers. I used dermabond for a sterile dressing. The kyphoplasty incisions were each closed with a simple suture. Dermabond was also used on these smaller incisions.   PLAN OF CARE: Admit for overnight observation  PATIENT DISPOSITION:  PACU - hemodynamically stable.   Delay start of Pharmacological VTE agent (>24hrs) due to surgical blood loss or risk of bleeding:  yes

## 2017-06-19 NOTE — Anesthesia Preprocedure Evaluation (Addendum)
Anesthesia Evaluation  Patient identified by MRN, date of birth, ID band Patient awake    Reviewed: Allergy & Precautions, NPO status , Patient's Chart, lab work & pertinent test results  Airway Mallampati: II  TM Distance: >3 FB Neck ROM: Full    Dental  (+) Edentulous Upper, Edentulous Lower   Pulmonary former smoker,    breath sounds clear to auscultation       Cardiovascular hypertension,  Rhythm:Regular Rate:Normal     Neuro/Psych TIA   GI/Hepatic negative GI ROS, Neg liver ROS,   Endo/Other  diabetes, Poorly Controlled  Renal/GU negative Renal ROS     Musculoskeletal  (+) Arthritis ,   Abdominal (+) + obese,   Peds  Hematology   Anesthesia Other Findings   Reproductive/Obstetrics                           Anesthesia Physical Anesthesia Plan  ASA: III  Anesthesia Plan: General   Post-op Pain Management:    Induction: Intravenous  PONV Risk Score and Plan: 4 or greater and Treatment may vary due to age or medical condition and Ondansetron  Airway Management Planned: Oral ETT  Additional Equipment:   Intra-op Plan:   Post-operative Plan: Extubation in OR  Informed Consent: I have reviewed the patients History and Physical, chart, labs and discussed the procedure including the risks, benefits and alternatives for the proposed anesthesia with the patient or authorized representative who has indicated his/her understanding and acceptance.   Dental advisory given  Plan Discussed with: CRNA  Anesthesia Plan Comments:         Anesthesia Quick Evaluation

## 2017-06-19 NOTE — Progress Notes (Signed)
Patient ambulated to [facility] with rolling walker and 1 person assist, patient tolerated well. Patient bleeding from surgical site, honeycomb dressing placed. MD notified. RN will continue to monitor.

## 2017-06-19 NOTE — Anesthesia Postprocedure Evaluation (Signed)
Anesthesia Post Note  Patient: Suzanne Faulkner  Procedure(s) Performed: LUMBAR LAMINECTOMYLUMBAR 4- LUMBAR 5, KYPHOPLASTY LUMBAR 4- LUMBAR 5 (N/A )     Patient location during evaluation: PACU Anesthesia Type: General Level of consciousness: awake, sedated and oriented Pain management: pain level controlled Vital Signs Assessment: post-procedure vital signs reviewed and stable Respiratory status: spontaneous breathing, nonlabored ventilation, respiratory function stable and patient connected to nasal cannula oxygen Cardiovascular status: blood pressure returned to baseline and stable Postop Assessment: no apparent nausea or vomiting Anesthetic complications: no    Last Vitals:  Vitals:   06/19/17 1315 06/19/17 1325  BP: 139/72   Pulse: 76 91  Resp: 15 17  Temp:  36.7 C  SpO2: 99% 100%    Last Pain:  Vitals:   06/19/17 1234  TempSrc:   PainSc: 0-No pain                 Maude Hettich,JAMES TERRILL

## 2017-06-19 NOTE — H&P (Signed)
There were no vitals taken for this visit. Suzanne Faulkner is a longstanding patient of mine who has been getting injections really for about the last 4 years. She comes back to see me because Dr. Maryjean Ka and I had a discussion. She has just undergone a myelogram, and what the myelogram showed was increasing loss of vertebral body height at both L4 and L5, where she appeared to have compression fractures. She cannot undergo an MRI secondary to having a spinal cord implant placed, and she is markedly stenotic at L4-5. She complains of pain in the back and pain in both lower extremities. I do think this is probably the best option for her is to undergo a kyphoplasty of both L4 and L5 and to undergo a simple decompression without any need for arthrodesis at L4-5. No Known Allergies Past Medical History:  Diagnosis Date  . Arthritis   . Colon polyps 2009  . Diabetes mellitus without complication ([place])    Type II  . History of kidney stones    passed  . Hypertension   . Nocturia    Past Surgical History:  Procedure Laterality Date  . ABDOMINAL HYSTERECTOMY    . CHOLECYSTECTOMY    . COLONOSCOPY    . goiter removal    . JOINT REPLACEMENT Left    knee  . TONSILLECTOMY     Family History  Problem Relation Age of Onset  . Heart disease Mother   . Diabetes Mother   . Cancer Father   . Cancer Sister        breast  . Breast cancer Sister   . Cancer Brother 36       pancreatic cancer   Social History   Socioeconomic History  . Marital status: Divorced    Spouse name: Not on file  . Number of children: Not on file  . Years of education: Not on file  . Highest education level: Not on file  Social Needs  . Financial resource strain: Not on file  . Food insecurity - worry: Not on file  . Food insecurity - inability: Not on file  . Transportation needs - medical: Not on file  . Transportation needs - non-medical: Not on file  Occupational History  . Not on file  Tobacco Use  . Smoking  status: Former Smoker    Packs/day: 0.75    Years: 47.00    Pack years: 35.25    Types: Cigarettes    Last attempt to quit: 01/08/2014    Years since quitting: 3.4  . Smokeless tobacco: Never Used  . Tobacco comment: Smoked 1/2-1 ppd since age 44 Quit May 2015  Substance and Sexual Activity  . Alcohol use: No  . Drug use: No  . Sexual activity: Not on file  Other Topics Concern  . Not on file  Social History Narrative  . Not on file    PHYSICAL EXAMINATION: Vital signs today include height 5 feet 6 inches, weight 247 pounds.  She is alert, oriented x4, and answers all questions appropriately. Walks with a cane. Gait is mildly unstable. Has full strength in the lower extremities.  I would expect her to be in through the weekend. We will try to get this done this coming Friday, as she is in a significant amount of pain. Risks and benefits were explained to both her and her daughter, bowel or bladder dysfunction, no relief, or damage to the nerve roots controlling bowel and bladder function. She already has urinary incontinence.  She believes that she would like to go ahead and proceed with surgery

## 2017-06-19 NOTE — Anesthesia Procedure Notes (Signed)
Procedure Name: Intubation Date/Time: 06/19/2017 10:07 AM Performed by: Valda Favia, CRNA Pre-anesthesia Checklist: Patient identified, Emergency Drugs available, Suction available, Patient being monitored and Timeout performed Patient Re-evaluated:Patient Re-evaluated prior to induction Oxygen Delivery Method: Circle system utilized Preoxygenation: Pre-oxygenation with 100% oxygen Induction Type: IV induction Ventilation: Mask ventilation without difficulty Laryngoscope Size: Mac and 4 Grade View: Grade I Tube type: Oral Tube size: 7.5 mm Number of attempts: 1 Airway Equipment and Method: Stylet Placement Confirmation: ETT inserted through vocal cords under direct vision,  positive ETCO2 and breath sounds checked- equal and bilateral Secured at: 21 cm Tube secured with: Tape Dental Injury: Teeth and Oropharynx as per pre-operative assessment

## 2017-06-20 ENCOUNTER — Other Ambulatory Visit: Payer: Self-pay

## 2017-06-20 DIAGNOSIS — I10 Essential (primary) hypertension: Secondary | ICD-10-CM | POA: Diagnosis not present

## 2017-06-20 DIAGNOSIS — E669 Obesity, unspecified: Secondary | ICD-10-CM | POA: Diagnosis not present

## 2017-06-20 DIAGNOSIS — M1990 Unspecified osteoarthritis, unspecified site: Secondary | ICD-10-CM | POA: Diagnosis not present

## 2017-06-20 DIAGNOSIS — M4856XA Collapsed vertebra, not elsewhere classified, lumbar region, initial encounter for fracture: Secondary | ICD-10-CM | POA: Diagnosis not present

## 2017-06-20 DIAGNOSIS — M48061 Spinal stenosis, lumbar region without neurogenic claudication: Secondary | ICD-10-CM | POA: Diagnosis not present

## 2017-06-20 DIAGNOSIS — E119 Type 2 diabetes mellitus without complications: Secondary | ICD-10-CM | POA: Diagnosis not present

## 2017-06-20 LAB — GLUCOSE, CAPILLARY
GLUCOSE-CAPILLARY: 126 mg/dL — AB (ref 65–99)
Glucose-Capillary: 110 mg/dL — ABNORMAL HIGH (ref 65–99)

## 2017-06-20 MED ORDER — OXYCODONE HCL 5 MG PO TABS: 5.0000 mg | ORAL_TABLET | Freq: Four times a day (QID) | ORAL | 0 refills | Status: DC | PRN

## 2017-06-20 MED ORDER — TIZANIDINE HCL 4 MG PO TABS: 4.0000 mg | ORAL_TABLET | Freq: Four times a day (QID) | ORAL | 0 refills | Status: DC | PRN

## 2017-06-20 NOTE — Progress Notes (Signed)
Talked to Dr. Annette Stable after ambulating and walking patient around the unit. Her BP was assessed lying, sitting, standing at 0 minutes then at 3 minutes without issue. She states she feels fine and was cleared to be released to the home today.

## 2017-06-20 NOTE — Care Management CC44 (Signed)
Condition Code 44 Documentation Completed  Patient Details  Name: Suzanne Faulkner MRN: [id number] Date of Birth: 1940-09-14   Condition Code 44 given:  Yes Patient signature on Condition Code 44 notice:  Yes Documentation of 2 MD's agreement:  Yes Code 44 added to claim:  Yes    Maryclare Labrador, RN 06/20/2017, 10:09 AM

## 2017-06-20 NOTE — Discharge Instructions (Addendum)
Laminectomy, Care After This sheet gives you information about how to care for yourself after your procedure. Your health care provider may also give you more specific instructions. If you have problems or questions, contact your health care provider. What can I expect after the procedure? After the procedure, it is common to have:  Some pain around your incision area.  Muscle tightening (spasms) across the back.  Follow these instructions at home: Incision care  Follow instructions from your health care provider about how to take care of your incision area. Make sure you: ? Wash your hands with soap and water before and after you apply medicine to the area or change your bandage (dressing). If soap and water are not available, use hand sanitizer. ? Change your dressing as told by your health care provider. ? Leave stitches (sutures), skin glue, or adhesive strips in place. These skin closures may need to stay in place for 2 weeks or longer. If adhesive strip edges start to loosen and curl up, you may trim the loose edges. Do not remove adhesive strips completely unless your health care provider tells you to do that.  Check your incision area every day for signs of infection. Check for: ? More redness, swelling, or pain. ? More fluid or blood. ? Warmth. ? Pus or a bad smell. Medicines  Take over-the-counter and prescription medicines only as told by your health care provider.  If you were prescribed an antibiotic medicine, use it as told by your health care provider. Do not stop using the antibiotic even if you start to feel better. Bathing  Do not take baths, swim, or use a hot tub for 2 weeks, or until your incision has healed completely.  If your health care provider approves, you may take showers after your dressing has been removed. Activity  Return to your normal activities as told by your health care provider. Ask your health care provider what activities are safe for  you.  Avoid bending or twisting at your waist. Always bend at your knees.  Do not sit for more than 20-30 minutes at a time. Lie down or walk between periods of sitting.  Do not lift anything that is heavier than 10 lb (4.5 kg) or the limit that your health care provider tells you, until he or she says that it is safe.  Do not drive for 1 weeks after your procedure or for as long as your health care provider tells you.  Do not drive or use heavy machinery while taking prescription pain medicine. General instructions  To prevent or treat constipation while you are taking prescription pain medicine, your health care provider may recommend that you: ? Drink enough fluid to keep your urine clear or pale yellow. ? Take over-the-counter or prescription medicines. ? Eat foods that are high in fiber, such as fresh fruits and vegetables, whole grains, and beans. ? Limit foods that are high in fat and processed sugars, such as fried and sweet foods.  Do breathing exercises as told.  Keep all follow-up visits as told by your health care provider. This is important. Contact a health care provider if:  You have more redness, swelling, or pain around your incision area.  Your incision feels warm to the touch.  You are not able to return to activities or do exercises as told by your health care provider. Get help right away if:  You have: ? More fluid or blood coming from your incision area. ? Pus or  a bad smell coming from your incision area. ? Chills or a fever. ? Episodes of dizziness or fainting while standing.  You develop a rash.  You develop shortness of breath or you have difficulty breathing.  You cannot control when you urinate or have a bowel movement.  You become weak.  You are not able to use your legs. Summary  After the procedure, it is common to have some pain around your incision area. You may also have muscle tightening (spasms) across the back.  Follow  instructions from your health care provider about how to care for your incision.  Do not lift anything that is heavier than 10 lb (4.5 kg) or the limit that your health care provider tells you, until he or she says that it is safe.  Contact your health care provider if you have more redness, swelling, or pain around your incision area or if your incision feels warm to the touch. These can be signs of infection.  You may shower, the wound may get wet. Just pat that area dry after showering. No baths for at least two weeks.

## 2017-06-20 NOTE — Evaluation (Signed)
Physical Therapy Evaluation Patient Details Name: Suzanne Faulkner MRN: [id number] DOB: Jul 16, 1941 Today's Date: 06/20/2017   History of Present Illness   Suzanne Faulkner  76 y.o. female with compression fractures at L4, and L5, along with severe stenosis at L4/5. Pt underwent lumbar laminectony L4-5 and kyphoplasty L4. PMH: HTN, DM  Clinical Impression  Pt admitted with above diagnosis. Pt currently with functional limitations due to the deficits listed below (see PT Problem List). Pt ambulated this morning without incidence but this afternoon while ambulating, she became diaphoretic and dizzy and needed to return to sitting. Vitals taken and were stable, RN checking blood sugar after session. Recommend HHPT at d/c for pt to have supervision with mobility progression.  Pt will benefit from skilled PT to increase their independence and safety with mobility to allow discharge to the venue listed below.       Follow Up Recommendations Home health PT    Equipment Recommendations  Rolling walker with 5" wheels    Recommendations for Other Services       Precautions / Restrictions Precautions Precautions: Back Precaution Booklet Issued: Yes (comment) Precaution Comments: reviewed back prec handout Restrictions Weight Bearing Restrictions: No      Mobility  Bed Mobility               General bed mobility comments: pt received sitting edge of chair  Transfers Overall transfer level: Needs assistance Equipment used: Rolling walker (2 wheeled) Transfers: Sit to/from Stand Sit to Stand: Supervision;Min assist         General transfer comment: supervision from bed but when she was getting up from lower surface, she needed min A  Ambulation/Gait Ambulation/Gait assistance: Min guard;Min assist Ambulation Distance (Feet): 120 Feet Assistive device: Rolling walker (2 wheeled) Gait Pattern/deviations: Trunk flexed;Step-through pattern Gait velocity: decreased Gait velocity  interpretation: <1.8 ft/sec, indicative of risk for recurrent falls General Gait Details: vc's for erect posture. After ~60', pt began to have discomfort L buttocks. After 120', pt became diaphoretic and mildly dizzy so ambulation discontinued  Stairs Stairs: Yes Stairs assistance: Min assist Stair Management: Sideways;One rail Left;Step to pattern Number of Stairs: 2 General stair comments: min A for support and heavy reliance on hand rails. Pt has wall at home but no rail, discussed the need for family to be with her going into home  Wheelchair Mobility    Modified Rankin (Stroke Patients Only)       Balance Overall balance assessment: Needs assistance Sitting-balance support: No upper extremity supported Sitting balance-Leahy Scale: Good     Standing balance support: Bilateral upper extremity supported Standing balance-Leahy Scale: Poor Standing balance comment: reliant on UE support                             Pertinent Vitals/Pain Pain Assessment: Faces Faces Pain Scale: Hurts little more Pain Location: L buttocks with ambulation Pain Descriptors / Indicators: Aching Pain Intervention(s): Limited activity within patient's tolerance;Monitored during session    Home Living Family/patient expects to be discharged to:: Private residence Living Arrangements: Children;Other relatives Available Help at Discharge: Family;Available PRN/intermittently Type of Home: House Home Access: Stairs to enter Entrance Stairs-Rails: None Entrance Stairs-Number of Steps: 2 Home Layout: One level Home Equipment: Cane - single point Additional Comments: pt lives with daughter and grandaughter    Prior Function Level of Independence: Independent with assistive device(s)         Comments: pt was limited in activity  level due to pain     Hand Dominance        Extremity/Trunk Assessment   Upper Extremity Assessment Upper Extremity Assessment: Overall WFL for tasks  assessed    Lower Extremity Assessment Lower Extremity Assessment: Generalized weakness    Cervical / Trunk Assessment Cervical / Trunk Assessment: Kyphotic  Communication   Communication: No difficulties  Cognition Arousal/Alertness: Awake/alert Behavior During Therapy: WFL for tasks assessed/performed Overall Cognitive Status: Within Functional Limits for tasks assessed                                        General Comments General comments (skin integrity, edema, etc.): after pt entered stairwell, she became diaphoretic and weak feeling. She sat on step and rested and chair was brought to her and she transferred into it. PT summoned RN and recliner was brought to wheel her back to room. She reported mild dizziness, no pain in sitting. Vitals stable.     Exercises     Assessment/Plan    PT Assessment Patient needs continued PT services  PT Problem List Decreased strength;Decreased activity tolerance;Decreased balance;Decreased mobility;Decreased knowledge of precautions;Decreased knowledge of use of DME;Pain       PT Treatment Interventions DME instruction;Gait training;Stair training;Functional mobility training;Therapeutic activities;Therapeutic exercise;Balance training;Patient/family education    PT Goals (Current goals can be found in the Care Plan section)  Acute Rehab PT Goals Patient Stated Goal: go home PT Goal Formulation: With patient Time For Goal Achievement: 06/27/17 Potential to Achieve Goals: Good    Frequency Min 5X/week   Barriers to discharge        Co-evaluation               AM-PAC PT "6 Clicks" Daily Activity  Outcome Measure Difficulty turning over in bed (including adjusting bedclothes, sheets and blankets)?: A Little Difficulty moving from lying on back to sitting on the side of the bed? : A Little Difficulty sitting down on and standing up from a chair with arms (e.g., wheelchair, bedside commode, etc,.)?: A  Little Help needed moving to and from a bed to chair (including a wheelchair)?: A Little Help needed walking in hospital room?: A Little Help needed climbing 3-5 steps with a railing? : A Lot 6 Click Score: 17    End of Session Equipment Utilized During Treatment: Gait belt Activity Tolerance: Treatment limited secondary to medical complications (Comment)(dizziness, diaphoresis) Patient left: in chair;with call bell/phone within reach Nurse Communication: Mobility status PT Visit Diagnosis: Muscle weakness (generalized) (M62.81);Difficulty in walking, not elsewhere classified (R26.2);Pain Pain - part of body: (back and buttocks)    Time: 1638-4665 PT Time Calculation (min) (ACUTE ONLY): 29 min   Charges:   PT Evaluation $PT Eval Moderate Complexity: 1 Mod PT Treatments $Gait Training: 8-22 mins   PT G Codes:   PT G-Codes **NOT FOR INPATIENT CLASS** Functional Assessment Tool Used: AM-PAC 6 Clicks Basic Mobility Functional Limitation: Mobility: Walking and moving around Mobility: Walking and Moving Around Current Status (L9357): At least 40 percent but less than 60 percent impaired, limited or restricted Mobility: Walking and Moving Around Goal Status [phone]): At least 1 percent but less than 20 percent impaired, limited or restricted    [place], PT  Acute Rehab Services  [place] 06/20/2017, 2:17 PM

## 2017-06-20 NOTE — Discharge Summary (Signed)
Physician Discharge Summary  Patient ID: Suzanne Faulkner MRN: [id number] DOB/AGE: [birth year] 76 y.o.  Admit date: 06/19/2017 Discharge date: 06/20/2017  Admission Diagnoses:Lumbar stenosis L4/5, Lumbar compression fracture L4, L5  Discharge Diagnoses: same Active Problems:   Lumbar stenosis with neurogenic claudication   Discharged Condition: good  Hospital Course: Suzanne Faulkner was admitted and taken to the operating room for an uncomplicated lumbar decompression at L4/5, and a kyphoplasty of L4. Post op she is ambulating, voiding, and tolerating a regular diet. Her wound is clean, dry, and without signs of infection.   Treatments: surgery: as above  Discharge Exam: Blood pressure 106/75, pulse 71, temperature 99.1 F (37.3 C), temperature source Oral, resp. rate 20, height 5' 5.98" (1.676 m), weight 112 kg (246 lb 16 oz), SpO2 97 %. General appearance: alert, cooperative, appears stated age and no distress Neurologic: Alert and oriented X 3, normal strength and tone. Normal symmetric reflexes. Normal coordination and gait  Disposition: 01-Home or Self Care SPINAL STENOSIS OF LUMBAR REGION WITHOUT NEUROGENIC CLAUDICATION  Allergies as of 06/20/2017   No Known Allergies     Medication List    TAKE these medications   aspirin 325 MG tablet Take 325 mg by mouth daily.   b complex vitamins tablet Take 1 tablet by mouth every morning.   benazepril 10 MG tablet Commonly known as:  LOTENSIN TAKE ONE TABLET BY MOUTH ONCE DAILY   CALCIUM + D PO Take 1 tablet by mouth every morning.   hydrochlorothiazide 12.5 MG capsule Commonly known as:  MICROZIDE TAKE ONE CAPSULE BY MOUTH ONCE DAILY   lovastatin 20 MG tablet Commonly known as:  MEVACOR TAKE ONE TABLET BY MOUTH AT BEDTIME   metFORMIN 500 MG tablet Commonly known as:  GLUCOPHAGE Take 1 tablet (500 mg total) by mouth daily.   oxyCODONE 5 MG immediate release tablet Commonly known as:  Oxy IR/ROXICODONE Take 1 tablet (5  mg total) every 6 (six) hours as needed by mouth for severe pain. What changed:  when to take this   tiZANidine 4 MG tablet Commonly known as:  ZANAFLEX Take 1 tablet (4 mg total) every 6 (six) hours as needed by mouth for muscle spasms.   VITAMIN C PO Take 1 tablet by mouth every morning.      Follow-up Information    Ashok Pall, MD Follow up in 3 week(s).   Specialty:  Neurosurgery Why:  please call the office to make an appointment Contact information: [address] [place] [address] [phone]           Signed: Winfield Cunas 06/20/2017, 9:18 AM

## 2017-06-20 NOTE — Progress Notes (Signed)
Patient d/c instructions given, questions answered. IV removed. Prescriptions given to patient. Patient to follow up after d/c to receive RW per PT recommendation--RN unable to contact CSW before close of business day for pt to receive before d/c.

## 2017-06-20 NOTE — Plan of Care (Signed)
  Progressing Education: Knowledge of General Education information will improve 06/20/2017 0318 - Progressing by Ria Bush, RN Health Behavior/Discharge Planning: Ability to manage health-related needs will improve 06/20/2017 0318 - Progressing by Ria Bush, RN Clinical Measurements: Ability to maintain clinical measurements within normal limits will improve 06/20/2017 0318 - Progressing by Ria Bush, RN Will remain free from infection 06/20/2017 0318 - Progressing by Ria Bush, RN Diagnostic test results will improve 06/20/2017 0318 - Progressing by Ria Bush, RN Respiratory complications will improve 06/20/2017 0318 - Progressing by Ria Bush, RN Cardiovascular complication will be avoided 06/20/2017 0318 - Progressing by Ria Bush, RN Activity: Risk for activity intolerance will decrease 06/20/2017 0318 - Progressing by Ria Bush, RN Nutrition: Adequate nutrition will be maintained 06/20/2017 0318 - Progressing by Ria Bush, RN Coping: Level of anxiety will decrease 06/20/2017 0318 - Progressing by Ria Bush, RN Elimination: Will not experience complications related to bowel motility 06/20/2017 0318 - Progressing by Ria Bush, RN Will not experience complications related to urinary retention 06/20/2017 0318 - Progressing by Ria Bush, RN Pain Managment: General experience of comfort will improve 06/20/2017 0318 - Progressing by Ria Bush, RN Safety: Ability to remain free from injury will improve 06/20/2017 0318 - Progressing by Ria Bush, RN Skin Integrity: Risk for impaired skin integrity will decrease 06/20/2017 0318 - Progressing by Ria Bush, RN Activity: Ability to avoid complications of mobility impairment will improve 06/20/2017 0318 - Progressing by Ria Bush, RN Ability to tolerate increased activity will improve 06/20/2017 0318 - Progressing by  Ria Bush, RN Will remain free from falls 06/20/2017 0318 - Progressing by Ria Bush, RN Bowel/Gastric: Gastrointestinal status for postoperative course will improve 06/20/2017 0318 - Progressing by Ria Bush, RN Education: Ability to verbalize activity precautions or restrictions will improve 06/20/2017 0318 - Progressing by Ria Bush, RN Knowledge of the prescribed therapeutic regimen will improve 06/20/2017 0318 - Progressing by Ria Bush, RN Understanding of discharge needs will improve 06/20/2017 0318 - Progressing by Ria Bush, RN Physical Regulation: Ability to maintain clinical measurements within normal limits will improve 06/20/2017 0318 - Progressing by Ria Bush, RN Postoperative complications will be avoided or minimized 06/20/2017 0318 - Progressing by Ria Bush, RN Diagnostic test results will improve 06/20/2017 0318 - Progressing by Ria Bush, RN Pain Management: Pain level will decrease 06/20/2017 0318 - Progressing by Ria Bush, RN Skin Integrity: Signs of wound healing will improve 06/20/2017 0318 - Progressing by Ria Bush, RN Health Behavior/Discharge Planning: Identification of resources available to assist in meeting health care needs will improve 06/20/2017 0318 - Progressing by Ria Bush, RN Bladder/Genitourinary: Urinary functional status for postoperative course will improve 06/20/2017 0318 - Progressing by Ria Bush, RN

## 2017-06-20 NOTE — Progress Notes (Signed)
Per Dr. Annette Stable. Attempt ambulating patient in 2-3 hours, encourage PO fluids and reassess patient discharge. RN will continue to monitor patient.

## 2017-06-20 NOTE — Care Management Obs Status (Signed)
[facility] NOTIFICATION   Patient Details  Name: Arriona Prest MRN: [id number] Date of Birth: [birth year]   Medicare Observation Status Notification Given:  Yes    Maryclare Labrador, RN 06/20/2017, 10:09 AM

## 2017-06-20 NOTE — Progress Notes (Signed)
Patient states "I feel fine." Patient ambulated from recliner back to bed with no difficulty. On-call MD paged. RN will continue to monitor patient.

## 2017-06-22 ENCOUNTER — Encounter (HOSPITAL_COMMUNITY): Payer: Self-pay | Admitting: Neurosurgery

## 2017-08-12 ENCOUNTER — Other Ambulatory Visit: Payer: Self-pay | Admitting: Family Medicine

## 2017-08-12 DIAGNOSIS — Z1231 Encounter for screening mammogram for malignant neoplasm of breast: Secondary | ICD-10-CM

## 2017-08-28 ENCOUNTER — Ambulatory Visit
Admission: RE | Admit: 2017-08-28 | Discharge: 2017-08-28 | Disposition: A | Discharge status: Home / Self Care | Payer: Medicare Other | Source: Ambulatory Visit | Attending: Family Medicine | Admitting: Family Medicine

## 2017-08-28 DIAGNOSIS — Z1231 Encounter for screening mammogram for malignant neoplasm of breast: Secondary | ICD-10-CM | POA: Insufficient documentation

## 2017-09-10 ENCOUNTER — Ambulatory Visit
Admission: RE | Admit: 2017-09-10 | Discharge: 2017-09-10 | Disposition: A | Discharge status: Home / Self Care | Payer: Medicare Other | Source: Ambulatory Visit | Attending: Family Medicine | Admitting: Family Medicine

## 2017-09-10 ENCOUNTER — Ambulatory Visit (INDEPENDENT_AMBULATORY_CARE_PROVIDER_SITE_OTHER): Payer: Medicare Other | Admitting: Family Medicine

## 2017-09-10 ENCOUNTER — Encounter: Payer: Self-pay | Admitting: Family Medicine

## 2017-09-10 VITALS — BP 140/74 | HR 75 | Temp 98.0°F | Resp 16 | Ht 66.0 in | Wt 257.0 lb

## 2017-09-10 DIAGNOSIS — M79645 Pain in left finger(s): Secondary | ICD-10-CM

## 2017-09-10 MED ORDER — SIMVASTATIN 20 MG PO TABS: 20.0000 mg | ORAL_TABLET | Freq: Every day | ORAL | 3 refills | Status: DC

## 2017-09-10 MED ORDER — LISINOPRIL 10 MG PO TABS: 10.0000 mg | ORAL_TABLET | Freq: Every day | ORAL | 3 refills | Status: DC

## 2017-09-10 NOTE — Progress Notes (Signed)
Patient: Suzanne Faulkner Female    DOB: Dec 25, 1940   77 y.o.   MRN: [id number] Visit Date: 09/10/2017  Today's Provider: Lelon Huh, MD   Chief Complaint  Patient presents with  . Hand Pain    left thumb   Subjective:    Patient is here concerning her left thumb. Patient states she has had pain in the joint of left thumb for several years. Patient states about 2 weeks ago the pain worsened. Now her thumb hurts constantly. Patient states that if she hits her thumb on anything the pain is excruciating. Patient is able to bend left thumb and has no swelling. Patient states she has taken Aleve to help with the pain, but finds no relief.     Marland Kitchenlastbp3 BP Readings from Last 3 Encounters:  09/10/17 140/74  06/20/17 108/68  04/29/17 (!) 148/95     [facility] no longer has lovastatin at 4$. Simvastatin 20 is $4, atorvastatin 20 and 40 are $9     No Known Allergies   Current Outpatient Medications:  .  Ascorbic Acid (VITAMIN C PO), Take 1 tablet by mouth every morning., Disp: , Rfl:  .  aspirin 325 MG tablet, Take 325 mg by mouth daily., Disp: , Rfl:  .  b complex vitamins tablet, Take 1 tablet by mouth every morning. , Disp: , Rfl:  .  benazepril (LOTENSIN) 10 MG tablet, TAKE ONE TABLET BY MOUTH ONCE DAILY, Disp: 90 tablet, Rfl: 4 .  Calcium Carbonate-Vitamin D (CALCIUM + D PO), Take 1 tablet by mouth every morning. , Disp: , Rfl:  .  hydrochlorothiazide (MICROZIDE) 12.5 MG capsule, TAKE ONE CAPSULE BY MOUTH ONCE DAILY, Disp: 180 capsule, Rfl: 4 .  lovastatin (MEVACOR) 20 MG tablet, TAKE ONE TABLET BY MOUTH AT BEDTIME, Disp: 90 tablet, Rfl: 4 .  metFORMIN (GLUCOPHAGE) 500 MG tablet, Take 1 tablet (500 mg total) by mouth daily., Disp: 3 tablet, Rfl: 0 .  oxyCODONE (OXY IR/ROXICODONE) 5 MG immediate release tablet, Take 1 tablet (5 mg total) every 6 (six) hours as needed by mouth for severe pain. (Patient not taking: Reported on 09/10/2017), Disp: 30 tablet, Rfl: 0 .  tiZANidine  (ZANAFLEX) 4 MG tablet, Take 1 tablet (4 mg total) every 6 (six) hours as needed by mouth for muscle spasms. (Patient not taking: Reported on 09/10/2017), Disp: 45 tablet, Rfl: 0  Review of Systems  Constitutional: Negative for appetite change, chills, fatigue and fever.  Respiratory: Negative for chest tightness and shortness of breath.   Cardiovascular: Negative for chest pain and palpitations.  Gastrointestinal: Negative for abdominal pain, nausea and vomiting.  Musculoskeletal: Positive for arthralgias.  Neurological: Negative for dizziness and weakness.    Social History   Tobacco Use  . Smoking status: Former Smoker    Packs/day: 0.75    Years: 47.00    Pack years: 35.25    Types: Cigarettes    Last attempt to quit: 01/08/2014    Years since quitting: 3.6  . Smokeless tobacco: Never Used  . Tobacco comment: Smoked 1/2-1 ppd since age 1 Quit May 2015  Substance Use Topics  . Alcohol use: No   Objective:   BP 140/74 (BP Location: Right Arm, Patient Position: Sitting, Cuff Size: Large)   Pulse 75   Temp 98 F (36.7 C) (Oral)   Resp 16   Ht 5\' 6"  (1.676 m)   Wt 257 lb (116.6 kg)   SpO2 97%   BMI 41.48 kg/m  Vitals:   09/10/17 1111  BP: 140/74  Pulse: 75  Resp: 16  Temp: 98 F (36.7 C)  TempSrc: Oral  SpO2: 97%  Weight: 257 lb (116.6 kg)  Height: 5\' 6"  (1.676 m)     Physical Exam  General appearance: alert, well developed, well nourished, cooperative and in no distress Head: Normocephalic, without obvious abnormality, atraumatic Respiratory: Respirations even and unlabored, normal respiratory rate Extremities: Slightly hyperextended left first IP. No tenderness to gentle palpations, but tender when hitting hard surface. No erythema. No pain with flexion or extension against resistance.       Assessment & Plan:     1. Thumb pain, left  - DG Finger Thumb Left; Future      The entirety of the information documented in the History of Present Illness,  Review of Systems and Physical Exam were personally obtained by me. Portions of this information were initially documented by April M. Sabra Heck, CMA and reviewed by me for thoroughness and accuracy.    Lelon Huh, MD  [facility]

## 2017-09-10 NOTE — Patient Instructions (Signed)
Go to the [facility] on [address] for [employer]

## 2017-09-11 ENCOUNTER — Telehealth: Payer: Self-pay | Admitting: Emergency Medicine

## 2017-09-11 DIAGNOSIS — M779 Enthesopathy, unspecified: Secondary | ICD-10-CM

## 2017-09-11 MED ORDER — NAPROXEN 500 MG PO TABS: 500.0000 mg | ORAL_TABLET | Freq: Two times a day (BID) | ORAL | 1 refills | Status: AC

## 2017-09-11 NOTE — Telephone Encounter (Signed)
Pt advised. Med sent to pharamcy

## 2017-09-11 NOTE — Telephone Encounter (Signed)
-----   Message from Birdie Sons, MD sent at 09/11/2017  8:06 AM EST ----- Xray of hand is normal. She probably has tendonitis. Try naproxen 500mg  one tablet twice daily, #30. rf x 1. If not much better in 2 weeks then call back for referral to orthopedics.

## 2017-10-09 ENCOUNTER — Encounter: Payer: Self-pay | Admitting: Family Medicine

## 2017-10-09 ENCOUNTER — Ambulatory Visit (INDEPENDENT_AMBULATORY_CARE_PROVIDER_SITE_OTHER): Payer: Medicare Other | Admitting: Family Medicine

## 2017-10-09 VITALS — BP 120/70 | HR 61 | Temp 98.3°F | Resp 16 | Ht 66.0 in | Wt 266.0 lb

## 2017-10-09 DIAGNOSIS — M79645 Pain in left finger(s): Secondary | ICD-10-CM

## 2017-10-09 DIAGNOSIS — I10 Essential (primary) hypertension: Secondary | ICD-10-CM | POA: Diagnosis not present

## 2017-10-09 DIAGNOSIS — E785 Hyperlipidemia, unspecified: Secondary | ICD-10-CM

## 2017-10-09 DIAGNOSIS — E119 Type 2 diabetes mellitus without complications: Secondary | ICD-10-CM

## 2017-10-09 LAB — POCT GLYCOSYLATED HEMOGLOBIN (HGB A1C)
ESTIMATED AVERAGE GLUCOSE: 120
HEMOGLOBIN A1C: 5.8

## 2017-10-09 NOTE — Progress Notes (Signed)
`      Patient: Suzanne Faulkner Female    DOB: 11-15-1940   77 y.o.   MRN: [id number] Visit Date: 10/09/2017  Today's Provider: Lelon Huh, MD   Chief Complaint  Patient presents with  . Follow-up  . Diabetes  . Hypertension  . Hyperlipidemia   Subjective:    HPI    Follow up thumb pain Seen 09/10/2017 X-ray ordered left thumb pain. X-ray was normal, given rx for naproxen for suspected tendonitis but did not help at all. States pain keeps her up at night, is intermittent during day. Is very tender to touch but not swollen.       Diabetes Mellitus Type II, Follow-up:   Lab Results  Component Value Date   HGBA1C 5.8 10/09/2017   HGBA1C 5.6 04/23/2017   HGBA1C 6.0 10/22/2016   Last seen for diabetes 6 months ago.  Management since then includes; no changes. She reports fair compliance with treatment. She is not having side effects. none Current symptoms include none and have been unchanged. Home blood sugar records: fasting range: not checking  Episodes of hypoglycemia? no   Current Insulin Regimen: n/a Most Recent Eye Exam: 10/29/2015 Weight trend: stable Prior visit with dietician: no Current diet: in general, an "unhealthy" diet Current exercise: none  ----------------------------------------------------------------   Hypertension, follow-up:  BP Readings from Last 3 Encounters:  10/09/17 120/70  09/10/17 140/74  06/20/17 108/68    She was last seen for hypertension 6 months ago.  BP at that visit was 120/86. Management since that visit includes; labs checked, no changes.She reports good compliance with treatment. She is not having side effects. none She is not exercising. She is adherent to low salt diet.   Outside blood pressures are not checking. She is experiencing none.  Patient denies none.   Cardiovascular risk factors include advanced age (older than 61 for men, 12 for women) and diabetes mellitus.  Use of agents associated with  hypertension: none.   ----------------------------------------------------------------    Lipid/Cholesterol, Follow-up:   Last seen for this 6 months ago.  Management since that visit includes; labs checked, no changes.  Last Lipid Panel:    Component Value Date/Time   CHOL 192 04/23/2017 1152   CHOL 132 05/05/2016 0920   TRIG 95 04/23/2017 1152   HDL 78 04/23/2017 1152   HDL 61 05/05/2016 0920   CHOLHDL 2.5 04/23/2017 1152   VLDL 25 01/26/2013 0543   LDLCALC 51 05/05/2016 0920    She reports good compliance with treatment. She is not having side effects. none  Wt Readings from Last 3 Encounters:  10/09/17 266 lb (120.7 kg)  09/10/17 257 lb (116.6 kg)  06/19/17 246 lb 16 oz (112 kg)    ----------------------------------------------------------------  No Known Allergies   Current Outpatient Medications:  .  Ascorbic Acid (VITAMIN C PO), Take 1 tablet by mouth every morning., Disp: , Rfl:  .  aspirin 325 MG tablet, Take 325 mg by mouth daily., Disp: , Rfl:  .  b complex vitamins tablet, Take 1 tablet by mouth every morning. , Disp: , Rfl:  .  benazepril (LOTENSIN) 10 MG tablet, TAKE ONE TABLET BY MOUTH ONCE DAILY, Disp: 90 tablet, Rfl: 4 .  Calcium Carbonate-Vitamin D (CALCIUM + D PO), Take 1 tablet by mouth every morning. , Disp: , Rfl:  .  hydrochlorothiazide (MICROZIDE) 12.5 MG capsule, TAKE ONE CAPSULE BY MOUTH ONCE DAILY, Disp: 180 capsule, Rfl: 4 .  lisinopril (PRINIVIL,ZESTRIL) 10 MG tablet, Take 1  tablet (10 mg total) by mouth daily., Disp: 30 tablet, Rfl: 3 .  lovastatin (MEVACOR) 20 MG tablet, TAKE ONE TABLET BY MOUTH AT BEDTIME, Disp: 90 tablet, Rfl: 4 .  metFORMIN (GLUCOPHAGE) 500 MG tablet, Take 1 tablet (500 mg total) by mouth daily., Disp: 3 tablet, Rfl: 0 .  naproxen (NAPROSYN) 500 MG tablet, Take 1 tablet (500 mg total) by mouth 2 (two) times daily with a meal., Disp: 30 tablet, Rfl: 1 .  simvastatin (ZOCOR) 20 MG tablet, Take 1 tablet (20 mg total) by  mouth at bedtime., Disp: 30 tablet, Rfl: 3  Review of Systems  Constitutional: Negative for appetite change, chills, fatigue and fever.  Respiratory: Negative for chest tightness and shortness of breath.   Cardiovascular: Negative for chest pain and palpitations.  Gastrointestinal: Negative for abdominal pain, nausea and vomiting.  Neurological: Negative for dizziness and weakness.    Social History   Tobacco Use  . Smoking status: Former Smoker    Packs/day: 0.75    Years: 47.00    Pack years: 35.25    Types: Cigarettes    Last attempt to quit: 01/08/2014    Years since quitting: 3.7  . Smokeless tobacco: Never Used  . Tobacco comment: Smoked 1/2-1 ppd since age 64 Quit May 2015  Substance Use Topics  . Alcohol use: No   Objective:   BP 120/70 (BP Location: Right Arm, Patient Position: Sitting, Cuff Size: Large)   Pulse 61   Temp 98.3 F (36.8 C) (Oral)   Resp 16   Ht 5\' 6"  (1.676 m)   Wt 266 lb (120.7 kg)   SpO2 99%   BMI 42.93 kg/m  Vitals:   10/09/17 1057  BP: 120/70  Pulse: 61  Resp: 16  Temp: 98.3 F (36.8 C)  TempSrc: Oral  SpO2: 99%  Weight: 266 lb (120.7 kg)  Height: 5\' 6"  (1.676 m)     Physical Exam    General appearance: alert, well developed, well nourished, cooperative and in no distress Head: Normocephalic, without obvious abnormality, atraumatic Respiratory: Respirations even and unlabored, normal respiratory rate Extremities: Slightly hyperextended left first IP. No tenderness to gentle palpations, but tender when hitting hard surface. No erythema. No pain with flexion or extension against resistance.   Results for orders placed or performed in visit on 10/09/17  POCT glycosylated hemoglobin (Hb A1C)  Result Value Ref Range   Hemoglobin A1C 5.8    Est. average glucose Bld gHb Est-mCnc 120        Assessment & Plan:     1. Type 2 diabetes mellitus without complication, without long-term current use of insulin (HCC) Well controlled.   Continue current medications.   - POCT glycosylated hemoglobin (Hb A1C)  2. Essential hypertension Well controlled.  Continue current medications.    3. Hyperlipidemia, unspecified hyperlipidemia type She is tolerating lovastatin well with no adverse effects.    4. Thumb pain, left No improvement with scheduled NSAID - Ambulatory referral to Orthopedic Surgery   Return in about 6 months (around 04/11/2018) for AWV & follow up.       Lelon Huh, MD  [facility]

## 2017-10-15 DIAGNOSIS — R2232 Localized swelling, mass and lump, left upper limb: Secondary | ICD-10-CM | POA: Diagnosis not present

## 2017-10-19 ENCOUNTER — Other Ambulatory Visit: Payer: Self-pay | Admitting: Orthopaedic Surgery

## 2017-10-19 DIAGNOSIS — R2232 Localized swelling, mass and lump, left upper limb: Secondary | ICD-10-CM

## 2017-10-29 ENCOUNTER — Ambulatory Visit
Admission: RE | Admit: 2017-10-29 | Discharge: 2017-10-29 | Disposition: A | Discharge status: Home / Self Care | Payer: Medicare Other | Source: Ambulatory Visit | Attending: Orthopaedic Surgery | Admitting: Orthopaedic Surgery

## 2017-10-29 DIAGNOSIS — R2232 Localized swelling, mass and lump, left upper limb: Secondary | ICD-10-CM | POA: Diagnosis not present

## 2017-10-29 DIAGNOSIS — M25742 Osteophyte, left hand: Secondary | ICD-10-CM | POA: Insufficient documentation

## 2017-10-29 DIAGNOSIS — M7989 Other specified soft tissue disorders: Secondary | ICD-10-CM | POA: Diagnosis not present

## 2017-10-29 LAB — POCT I-STAT CREATININE: CREATININE: 1.4 mg/dL — AB (ref 0.44–1.00)

## 2017-10-29 MED ORDER — IOPAMIDOL (ISOVUE-300) INJECTION 61%
75.0000 mL | Freq: Once | INTRAVENOUS | Status: AC | PRN
  Administered 2017-10-29: 75 mL via INTRAVENOUS

## 2017-11-03 DIAGNOSIS — L989 Disorder of the skin and subcutaneous tissue, unspecified: Secondary | ICD-10-CM | POA: Diagnosis not present

## 2017-11-03 DIAGNOSIS — R2232 Localized swelling, mass and lump, left upper limb: Secondary | ICD-10-CM | POA: Diagnosis not present

## 2017-11-05 DIAGNOSIS — R2232 Localized swelling, mass and lump, left upper limb: Secondary | ICD-10-CM | POA: Diagnosis not present

## 2018-02-08 DIAGNOSIS — E113393 Type 2 diabetes mellitus with moderate nonproliferative diabetic retinopathy without macular edema, bilateral: Secondary | ICD-10-CM | POA: Diagnosis not present

## 2018-02-18 ENCOUNTER — Encounter: Payer: Self-pay | Admitting: General Surgery

## 2018-02-18 ENCOUNTER — Ambulatory Visit (INDEPENDENT_AMBULATORY_CARE_PROVIDER_SITE_OTHER): Payer: Medicare Other | Admitting: General Surgery

## 2018-02-18 VITALS — BP 132/88 | HR 82 | Resp 12 | Ht 66.0 in | Wt 261.0 lb

## 2018-02-18 DIAGNOSIS — Z86010 Personal history of colonic polyps: Secondary | ICD-10-CM

## 2018-02-18 MED ORDER — AMOXICILLIN 500 MG PO CAPS: 500.0000 mg | ORAL_CAPSULE | ORAL | 0 refills | Status: DC

## 2018-02-18 MED ORDER — POLYETHYLENE GLYCOL 3350 17 GM/SCOOP PO POWD: 1.0000 | Freq: Once | ORAL | 0 refills | Status: AC

## 2018-02-18 NOTE — Progress Notes (Signed)
Patient ID: Suzanne Faulkner, female   DOB: 01-05-1941, 77 y.o.   MRN: [id number]  Chief Complaint  Patient presents with  . Colonoscopy    HPI Suzanne Faulkner is a 77 y.o. female here today for a evaluation of a colonoscopy. Last colonoscopy was 02-08-13.  No GI problems at this time. Bowels move 1 a week, no bleeding. She does admit to bloating between BM. She admits to urinating slow over the past several months.  She admits to having trouble with smell sensation as well.  HPI  Past Medical History:  Diagnosis Date  . Arthritis   . Colon polyps 2009, 2014   TUBULAR ADENOMA  . Diabetes mellitus without complication ([place])    Type II  . History of kidney stones    passed  . Hypertension   . Nocturia     Past Surgical History:  Procedure Laterality Date  . ABDOMINAL HYSTERECTOMY    . CHOLECYSTECTOMY    . COLONOSCOPY  2009, 2014   Dr Bary Castilla  . goiter removal    . JOINT REPLACEMENT Left    knee  . LUMBAR LAMINECTOMY/DECOMPRESSION MICRODISCECTOMY N/A 06/19/2017   Procedure: LUMBAR LAMINECTOMYLUMBAR 4- LUMBAR 5, KYPHOPLASTY LUMBAR 4- LUMBAR 5;  Surgeon: Ashok Pall, MD;  Location: [place];  Service: Neurosurgery;  Laterality: N/A;  LUMBAR LAMINECTOMYLUMBAR 4- LUMBAR 5, KYPHOPLASTY LUMBAR 4- LUMBAR  . SPINAL CORD STIMULATOR INSERTION N/A 03/21/2016   Procedure: LUMBAR SPINAL CORD STIMULATOR INSERTION;  Surgeon: Clydell Hakim, MD;  Location: [place] [facility];  Service: Neurosurgery;  Laterality: N/A;  . TONSILLECTOMY      Family History  Problem Relation Age of Onset  . Heart disease Mother   . Diabetes Mother   . Cancer Father   . Cancer Sister        breast  . Breast cancer Sister   . Cancer Brother 60       pancreatic cancer  . Stomach cancer Brother   . Bone cancer Brother     Social History Social History   Tobacco Use  . Smoking status: Former Smoker    Packs/day: 0.75    Years: 47.00    Pack years: 35.25    Types: Cigarettes    Last attempt to quit: 01/08/2014    Years  since quitting: 4.1  . Smokeless tobacco: Never Used  . Tobacco comment: Smoked 1/2-1 ppd since age 21 Quit May 2015  Substance Use Topics  . Alcohol use: No  . Drug use: No    No Known Allergies  Current Outpatient Medications  Medication Sig Dispense Refill  . Ascorbic Acid (VITAMIN C PO) Take 1 tablet by mouth every morning.    Marland Kitchen aspirin 325 MG tablet Take 325 mg by mouth daily.    Marland Kitchen b complex vitamins tablet Take 1 tablet by mouth every morning.     . Calcium Carbonate-Vitamin D (CALCIUM + D PO) Take 1 tablet by mouth every morning.     . hydrochlorothiazide (MICROZIDE) 12.5 MG capsule TAKE ONE CAPSULE BY MOUTH ONCE DAILY 180 capsule 4  . lisinopril (PRINIVIL,ZESTRIL) 10 MG tablet Take 1 tablet (10 mg total) by mouth daily. 30 tablet 3  . metFORMIN (GLUCOPHAGE) 500 MG tablet Take 1 tablet (500 mg total) by mouth daily. 3 tablet 0  . simvastatin (ZOCOR) 20 MG tablet Take 1 tablet (20 mg total) by mouth at bedtime. 30 tablet 3  . amoxicillin (AMOXIL) 500 MG capsule Take 1 capsule (500 mg total) by mouth as directed.  Take all 4 caps on the morning of your colonoscopy 1 hour prior to leaving your house. 4 capsule 0  . naproxen (NAPROSYN) 500 MG tablet Take 1 tablet (500 mg total) by mouth 2 (two) times daily with a meal. (Patient not taking: Reported on 02/18/2018) 30 tablet 1   No current facility-administered medications for this visit.     Review of Systems Review of Systems  Constitutional: Negative.   Respiratory: Negative.   Cardiovascular: Negative.   Gastrointestinal: Positive for constipation.    Blood pressure 132/88, pulse 82, resp. rate 12, height 5\' 6"  (1.676 m), weight 261 lb (118.4 kg), SpO2 99 %.  Physical Exam Physical Exam  Constitutional: She is oriented to person, place, and time. She appears well-developed and well-nourished.  HENT:  Mouth/Throat: Oropharynx is clear and moist.  Eyes: Conjunctivae are normal. No scleral icterus.  Neck: Neck supple.   Cardiovascular: Normal rate, regular rhythm and normal heart sounds.  Pulmonary/Chest: Effort normal and breath sounds normal.  Lymphadenopathy:    She has no cervical adenopathy.  Neurological: She is alert and oriented to person, place, and time.  Skin: Skin is warm and dry.  Psychiatric: Her behavior is normal.    Data Reviewed February 09, 2013 colonoscopy showed a  49mm polyp at the hepatic flexure.  Tubular adenoma without atypia.   Assessment    Candidate for follow-up colonoscopy.  Extended prep necessary due to the patient's infrequent bowel movements.    Plan    Use Citrate of magnesium 2 days before colonoscopy Hold metformin for both clear liquid days and day of colonoscopy Will take 4 Amoxicillin tabs 1 hour prior to procedure   Colonoscopy with possible biopsy/polypectomy prn: Information regarding the procedure, including its potential risks and complications (including but not limited to perforation of the bowel, which may require emergency surgery to repair, and bleeding) was verbally given to the patient. Educational information regarding lower intestinal endoscopy was given to the patient. Written instructions for how to complete the bowel prep using Miralax were provided. The importance of drinking ample fluids to avoid dehydration as a result of the prep emphasized.    HPI, Physical Exam, Assessment and Plan have been scribed under the direction and in the presence of Robert Bellow, MD. Karie Fetch, RN   I have completed the exam and reviewed the above documentation for accuracy and completeness.  I agree with the above.  Haematologist has been used and any errors in dictation or transcription are unintentional.  Hervey Ard, M.D., F.A.C.S.  The patient is scheduled for a Colonoscopy at [facility] on 03/03/18. They are aware to call the day before to get their arrival time. She will complete a 2 day liquid diet. She will take 10 oz Mag Citrate 2 days prior  to the colonoscopy. She will decrease her Aspirin to 81 mg 1 week prior. She will hold her Metformin 2 days prior and the day of colonoscopy. She will take 4-500 mg Amoxicillin caps one hour prior to leaving for her colonoscopy. Miralax, and Amoxicillin prescriptions have been sent into the patient's pharmacy. The patient is aware of date and instructions.  Documented by Caryl-Lyn Otis Brace LPN   Forest Gleason Talar Fraley 02/19/2018, 4:49 PM

## 2018-02-18 NOTE — Patient Instructions (Addendum)
Use Citrate of magnesium 2 days before colonoscopy Hold metformin for both clear liquid days and day of colonoscopy Will take 4 Amoxicillin tabs 1 hour prior to procedure     Colonoscopy, Adult A colonoscopy is an exam to look at the entire large intestine. During the exam, a lubricated, bendable tube is inserted into the anus and then passed into the rectum, colon, and other parts of the large intestine. A colonoscopy is often done as a part of normal colorectal screening or in response to certain symptoms, such as anemia, persistent diarrhea, abdominal pain, and blood in the stool. The exam can help screen for and diagnose medical problems, including:  Tumors.  Polyps.  Inflammation.  Areas of bleeding.  Tell a health care provider about:  Any allergies you have.  All medicines you are taking, including vitamins, herbs, eye drops, creams, and over-the-counter medicines.  Any problems you or family members have had with anesthetic medicines.  Any blood disorders you have.  Any surgeries you have had.  Any medical conditions you have.  Any problems you have had passing stool. What are the risks? Generally, this is a safe procedure. However, problems may occur, including:  Bleeding.  A tear in the intestine.  A reaction to medicines given during the exam.  Infection (rare).  What happens before the procedure? Eating and drinking restrictions Follow instructions from your health care provider about eating and drinking, which may include:  A few days before the procedure - follow a low-fiber diet. Avoid nuts, seeds, dried fruit, raw fruits, and vegetables.  1-3 days before the procedure - follow a clear liquid diet. Drink only clear liquids, such as clear broth or bouillon, black coffee or tea, clear juice, clear soft drinks or sports drinks, gelatin dessert, and popsicles. Avoid any liquids that contain red or purple dye.  On the day of the procedure - do not eat  or drink anything during the 2 hours before the procedure, or within the time period that your health care provider recommends.  Bowel prep If you were prescribed an oral bowel prep to clean out your colon:  Take it as told by your health care provider. Starting the day before your procedure, you will need to drink a large amount of medicated liquid. The liquid will cause you to have multiple loose stools until your stool is almost clear or light green.  If your skin or anus gets irritated from diarrhea, you may use these to relieve the irritation: ? Medicated wipes, such as adult wet wipes with aloe and vitamin E. ? A skin soothing-product like petroleum jelly.  If you vomit while drinking the bowel prep, take a break for up to 60 minutes and then begin the bowel prep again. If vomiting continues and you cannot take the bowel prep without vomiting, call your health care provider.  General instructions  Ask your health care provider about changing or stopping your regular medicines. This is especially important if you are taking diabetes medicines or blood thinners.  Plan to have someone take you home from the hospital or clinic. What happens during the procedure?  An IV tube may be inserted into one of your veins.  You will be given medicine to help you relax (sedative).  To reduce your risk of infection: ? Your health care team will wash or sanitize their hands. ? Your anal area will be washed with soap.  You will be asked to lie on your side with your knees  bent.  Your health care provider will lubricate a long, thin, flexible tube. The tube will have a camera and a light on the end.  The tube will be inserted into your anus.  The tube will be gently eased through your rectum and colon.  Air will be delivered into your colon to keep it open. You may feel some pressure or cramping.  The camera will be used to take images during the procedure.  A small tissue sample may be  removed from your body to be examined under a microscope (biopsy). If any potential problems are found, the tissue will be sent to a lab for testing.  If small polyps are found, your health care provider may remove them and have them checked for cancer cells.  The tube that was inserted into your anus will be slowly removed. The procedure may vary among health care providers and hospitals. What happens after the procedure?  Your blood pressure, heart rate, breathing rate, and blood oxygen level will be monitored until the medicines you were given have worn off.  Do not drive for 24 hours after the exam.  You may have a small amount of blood in your stool.  You may pass gas and have mild abdominal cramping or bloating due to the air that was used to inflate your colon during the exam.  It is up to you to get the results of your procedure. Ask your health care provider, or the department performing the procedure, when your results will be ready. This information is not intended to replace advice given to you by your health care provider. Make sure you discuss any questions you have with your health care provider. Document Released: 07/25/2000 Document Revised: 05/28/2016 Document Reviewed: 10/09/2015 Elsevier Interactive Patient Education  [employer].  The patient is scheduled for a Colonoscopy at [facility] on 03/03/18. They are aware to call the day before to get their arrival time. She will complete a 2 day liquid diet. She will take 10 oz Mag Citrate 2 days prior to the colonoscopy. She will decrease her Aspirin to 81 mg 1 week prior. She will hold her Metformin 2 days prior and the day of colonoscopy. She will take 4-500 mg Amoxicillin caps one hour prior to leaving for her colonoscopy. Miralax, and Amoxicillin prescriptions have been sent into the patient's pharmacy. The patient is aware of date and instructions.

## 2018-03-03 ENCOUNTER — Encounter
Admission: RE | Disposition: A | Discharge status: Home / Self Care | Payer: Self-pay | Source: Ambulatory Visit | Attending: General Surgery

## 2018-03-03 ENCOUNTER — Ambulatory Visit: Payer: Medicare Other | Admitting: Registered Nurse

## 2018-03-03 ENCOUNTER — Ambulatory Visit
Admission: RE | Admit: 2018-03-03 | Discharge: 2018-03-03 | Disposition: A | Discharge status: Home / Self Care | Payer: Medicare Other | Source: Ambulatory Visit | Attending: General Surgery | Admitting: General Surgery

## 2018-03-03 ENCOUNTER — Other Ambulatory Visit: Payer: Self-pay

## 2018-03-03 DIAGNOSIS — K635 Polyp of colon: Secondary | ICD-10-CM | POA: Diagnosis not present

## 2018-03-03 DIAGNOSIS — Z86010 Personal history of colonic polyps: Secondary | ICD-10-CM | POA: Diagnosis not present

## 2018-03-03 DIAGNOSIS — Z79899 Other long term (current) drug therapy: Secondary | ICD-10-CM | POA: Diagnosis not present

## 2018-03-03 DIAGNOSIS — M1990 Unspecified osteoarthritis, unspecified site: Secondary | ICD-10-CM | POA: Diagnosis not present

## 2018-03-03 DIAGNOSIS — D123 Benign neoplasm of transverse colon: Secondary | ICD-10-CM | POA: Insufficient documentation

## 2018-03-03 DIAGNOSIS — I10 Essential (primary) hypertension: Secondary | ICD-10-CM | POA: Diagnosis not present

## 2018-03-03 DIAGNOSIS — E119 Type 2 diabetes mellitus without complications: Secondary | ICD-10-CM | POA: Diagnosis not present

## 2018-03-03 DIAGNOSIS — Z7982 Long term (current) use of aspirin: Secondary | ICD-10-CM | POA: Diagnosis not present

## 2018-03-03 DIAGNOSIS — Z87891 Personal history of nicotine dependence: Secondary | ICD-10-CM | POA: Insufficient documentation

## 2018-03-03 DIAGNOSIS — Z1211 Encounter for screening for malignant neoplasm of colon: Secondary | ICD-10-CM | POA: Diagnosis not present

## 2018-03-03 DIAGNOSIS — E785 Hyperlipidemia, unspecified: Secondary | ICD-10-CM | POA: Diagnosis not present

## 2018-03-03 DIAGNOSIS — K5730 Diverticulosis of large intestine without perforation or abscess without bleeding: Secondary | ICD-10-CM | POA: Insufficient documentation

## 2018-03-03 DIAGNOSIS — Z7984 Long term (current) use of oral hypoglycemic drugs: Secondary | ICD-10-CM | POA: Diagnosis not present

## 2018-03-03 HISTORY — PX: COLONOSCOPY WITH PROPOFOL: SHX5780

## 2018-03-03 LAB — GLUCOSE, CAPILLARY: GLUCOSE-CAPILLARY: 120 mg/dL — AB (ref 70–99)

## 2018-03-03 SURGERY — COLONOSCOPY WITH PROPOFOL
Anesthesia: General

## 2018-03-03 MED ORDER — PHENYLEPHRINE HCL 10 MG/ML IJ SOLN
INTRAMUSCULAR | Status: DC | PRN
  Administered 2018-03-03 (×2): 100 ug via INTRAVENOUS

## 2018-03-03 MED ORDER — SODIUM CHLORIDE 0.9 % IV SOLN
INTRAVENOUS | Status: DC
  Administered 2018-03-03: 1000 mL via INTRAVENOUS

## 2018-03-03 MED ORDER — PROPOFOL 500 MG/50ML IV EMUL
INTRAVENOUS | Status: DC | PRN
  Administered 2018-03-03: 140 ug/kg/min via INTRAVENOUS

## 2018-03-03 MED ORDER — LIDOCAINE HCL (PF) 1 % IJ SOLN
INTRAMUSCULAR | Status: AC
  Administered 2018-03-03: 0.3 mL
  Filled 2018-03-03: qty 2

## 2018-03-03 MED ORDER — PROPOFOL 10 MG/ML IV BOLUS
INTRAVENOUS | Status: DC | PRN
  Administered 2018-03-03: 70 mg via INTRAVENOUS
  Administered 2018-03-03: 20 mg via INTRAVENOUS
  Administered 2018-03-03: 30 mg via INTRAVENOUS

## 2018-03-03 NOTE — Transfer of Care (Signed)
Immediate Anesthesia Transfer of Care Note  Patient: Suzanne Faulkner  Procedure(s) Performed: COLONOSCOPY WITH PROPOFOL (N/A )  Patient Location: PACU  Anesthesia Type:General  Level of Consciousness: sedated  Airway & Oxygen Therapy: Patient Spontanous Breathing and Patient connected to nasal cannula oxygen  Post-op Assessment: Report given to RN and Post -op Vital signs reviewed and stable  Post vital signs: Reviewed and stable  Last Vitals:  Vitals Value Taken Time  BP 108/69 03/03/2018 11:59 AM  Temp 36.1 C 03/03/2018 11:59 AM  Pulse 54 03/03/2018 12:00 PM  Resp 14 03/03/2018 12:00 PM  SpO2 94 % 03/03/2018 12:00 PM  Vitals shown include unvalidated device data.  Last Pain:  Vitals:   03/03/18 1158  TempSrc: Tympanic  PainSc:          Complications: No apparent anesthesia complications

## 2018-03-03 NOTE — Anesthesia Postprocedure Evaluation (Signed)
Anesthesia Post Note  Patient: Suzanne Faulkner  Procedure(s) Performed: COLONOSCOPY WITH PROPOFOL (N/A )  Patient location during evaluation: Endoscopy Anesthesia Type: General Level of consciousness: awake and alert Pain management: pain level controlled Vital Signs Assessment: post-procedure vital signs reviewed and stable Respiratory status: spontaneous breathing, nonlabored ventilation and respiratory function stable Cardiovascular status: blood pressure returned to baseline and stable Postop Assessment: no apparent nausea or vomiting Anesthetic complications: no     Last Vitals:  Vitals:   03/03/18 1159 03/03/18 1208  BP: 108/69   Pulse: 60 (!) 52  Resp: 16 16  Temp: (!) 36.1 C   SpO2: 94% 96%    Last Pain:  Vitals:   03/03/18 1158  TempSrc: Tympanic  PainSc:                  Alphonsus Sias

## 2018-03-03 NOTE — Anesthesia Preprocedure Evaluation (Signed)
Anesthesia Evaluation  Patient identified by MRN, date of birth, ID band Patient awake    Reviewed: Allergy & Precautions, H&P , NPO status , reviewed documented beta blocker date and time   Airway Mallampati: II   Neck ROM: full    Dental  (+) Edentulous Upper, Edentulous Lower   Pulmonary former smoker,    Pulmonary exam normal        Cardiovascular hypertension, Normal cardiovascular exam  2014 ECHO Normal LV size with mild LV hypertrophy. EF 55-60%. Normal RV size and systolic function. No significant valvular abnormalities.   Neuro/Psych TIA   GI/Hepatic   Endo/Other  diabetes  Renal/GU      Musculoskeletal  (+) Arthritis ,   Abdominal   Peds  Hematology   Anesthesia Other Findings Past Medical History: No date: Arthritis 2009, 2014: Colon polyps     Comment:  TUBULAR ADENOMA No date: Diabetes mellitus without complication (HCC)     Comment:  Type II No date: History of kidney stones     Comment:  passed No date: Hypertension No date: Nocturia  Past Surgical History: No date: ABDOMINAL HYSTERECTOMY No date: CHOLECYSTECTOMY 2009, 2014: COLONOSCOPY     Comment:  Dr Bary Castilla No date: goiter removal No date: JOINT REPLACEMENT; Left     Comment:  knee 06/19/2017: LUMBAR LAMINECTOMY/DECOMPRESSION MICRODISCECTOMY; N/A     Comment:  Procedure: LUMBAR LAMINECTOMYLUMBAR 4- LUMBAR 5,               KYPHOPLASTY LUMBAR 4- LUMBAR 5;  Surgeon: Ashok Pall,               MD;  Location: [place];  Service: Neurosurgery;                Laterality: N/A;  LUMBAR LAMINECTOMYLUMBAR 4- LUMBAR 5,               KYPHOPLASTY LUMBAR 4- LUMBAR 03/21/2016: SPINAL CORD STIMULATOR INSERTION; N/A     Comment:  Procedure: LUMBAR SPINAL CORD STIMULATOR INSERTION;                Surgeon: Clydell Hakim, MD;  Location: [place];                Service: Neurosurgery;  Laterality: N/A; No date: TONSILLECTOMY  BMI    Body Mass  Index:  42.13 kg/m      Reproductive/Obstetrics                             Anesthesia Physical Anesthesia Plan  ASA: III  Anesthesia Plan: General   Post-op Pain Management:    Induction:   PONV Risk Score and Plan: 3 and Treatment may vary due to age or medical condition and TIVA  Airway Management Planned:   Additional Equipment:   Intra-op Plan:   Post-operative Plan:   Informed Consent: I have reviewed the patients History and Physical, chart, labs and discussed the procedure including the risks, benefits and alternatives for the proposed anesthesia with the patient or authorized representative who has indicated his/her understanding and acceptance.   Dental Advisory Given  Plan Discussed with: CRNA  Anesthesia Plan Comments:         Anesthesia Quick Evaluation

## 2018-03-03 NOTE — Anesthesia Post-op Follow-up Note (Signed)
Anesthesia QCDR form completed.        

## 2018-03-03 NOTE — Op Note (Signed)
[facility] Patient Name: Suzanne Faulkner Procedure Date: 03/03/2018 11:03 AM MRN: [id number] Account #: [id number] Date of Birth: 02-Sep-1940 Admit Type: Outpatient Age: 77 Room: [facility] ENDO ROOM 1 Gender: Female Note Status: Finalized Procedure:            Colonoscopy Indications:          High risk colon cancer surveillance: Personal history                        of colonic polyps Providers:            Robert Bellow, MD Referring MD:         Kirstie Peri. Caryn Section, MD (Referring MD) Medicines:            Monitored Anesthesia Care Complications:        No immediate complications. Procedure:            Pre-Anesthesia Assessment:                       - Prior to the procedure, a History and Physical was                        performed, and patient medications, allergies and                        sensitivities were reviewed. The patient's tolerance of                        previous anesthesia was reviewed.                       - The risks and benefits of the procedure and the                        sedation options and risks were discussed with the                        patient. All questions were answered and informed                        consent was obtained.                       After obtaining informed consent, the colonoscope was                        passed under direct vision. Throughout the procedure,                        the patient's blood pressure, pulse, and oxygen                        saturations were monitored continuously. The                        Colonoscope was introduced through the anus and                        advanced to the the cecum, identified by appendiceal  orifice and ileocecal valve. The colonoscopy was                        technically difficult and complex due to a redundant                        colon and significant looping. Successful completion of                        the  procedure was aided by changing the patient to a                        supine position. The patient tolerated the procedure                        well. The quality of the bowel preparation was                        excellent. Findings:      Many medium-mouthed diverticula were found in the sigmoid colon and       hepatic flexure.      Two sessile polyps were found in the transverse colon, distal transverse       colon and hepatic flexure. The polyps were 8 mm in size. These were       biopsied with a cold forceps for histology.      The retroflexed view of the distal rectum and anal verge was normal and       showed no anal or rectal abnormalities. Impression:           - Diverticulosis in the sigmoid colon and at the                        hepatic flexure.                       - Two 8 mm polyps in the transverse colon, in the                        distal transverse colon and at the hepatic flexure.                        Biopsied.                       - The distal rectum and anal verge are normal on                        retroflexion view. Recommendation:       - Telephone endoscopist for pathology results in 1 week. Procedure Code(s):    --- Professional ---                       [phone], Colonoscopy, flexible; with biopsy, single or                        multiple Diagnosis Code(s):    --- Professional ---                       Z86.010, Personal history of colonic polyps  D12.3, Benign neoplasm of transverse colon (hepatic                        flexure or splenic flexure)                       K57.30, Diverticulosis of large intestine without                        perforation or abscess without bleeding CPT copyright 2017 American [employer]. All rights reserved. The codes documented in this report are preliminary and upon coder review may  be revised to meet current compliance requirements. Robert Bellow, MD 03/03/2018 11:58:48 AM This  report has been signed electronically. Number of Addenda: 0 Note Initiated On: 03/03/2018 11:03 AM Scope Withdrawal Time: 0 hours 12 minutes 13 seconds  Total Procedure Duration: 0 hours 38 minutes 5 seconds       [facility]

## 2018-03-03 NOTE — H&P (Signed)
No change in clinical history or exam.  Extended prep well-tolerated although the patient is a little bit hungry at this time.  She reports stools are clear liquid.  Previous 5 mm tubular adenoma in the hepatic flexure.  For repeat colonoscopy.

## 2018-03-04 ENCOUNTER — Other Ambulatory Visit: Payer: Self-pay | Admitting: Family Medicine

## 2018-03-04 ENCOUNTER — Encounter: Payer: Self-pay | Admitting: General Surgery

## 2018-03-04 DIAGNOSIS — E119 Type 2 diabetes mellitus without complications: Secondary | ICD-10-CM

## 2018-03-04 LAB — SURGICAL PATHOLOGY

## 2018-03-05 ENCOUNTER — Telehealth: Payer: Self-pay | Admitting: General Surgery

## 2018-03-05 NOTE — Telephone Encounter (Signed)
Message left for the patient that the small polyp identified at the time of the colonoscopy on March 03, 2018 was benign, tubular adenoma without atypia. 2 less than 8 mm polyps were identified in the transverse colon.  Pathology showed no atypia.  Based on her age and general health, would likely not recommend a 5-year follow-up exam.  This could certainly be discussed at the time based on her health and desire to have a repeat colonoscopy.

## 2018-03-17 ENCOUNTER — Other Ambulatory Visit: Payer: Self-pay

## 2018-03-17 ENCOUNTER — Encounter (HOSPITAL_COMMUNITY): Payer: Self-pay

## 2018-03-17 ENCOUNTER — Emergency Department (HOSPITAL_COMMUNITY): Payer: Medicare Other

## 2018-03-17 ENCOUNTER — Emergency Department (HOSPITAL_COMMUNITY)
Admission: EM | Admit: 2018-03-17 | Discharge: 2018-03-17 | Disposition: A | Discharge status: Home / Self Care | Payer: Medicare Other | Attending: Emergency Medicine | Admitting: Emergency Medicine

## 2018-03-17 DIAGNOSIS — M545 Low back pain, unspecified: Secondary | ICD-10-CM

## 2018-03-17 DIAGNOSIS — Z87891 Personal history of nicotine dependence: Secondary | ICD-10-CM | POA: Diagnosis not present

## 2018-03-17 DIAGNOSIS — Z9689 Presence of other specified functional implants: Secondary | ICD-10-CM | POA: Diagnosis not present

## 2018-03-17 DIAGNOSIS — Z7984 Long term (current) use of oral hypoglycemic drugs: Secondary | ICD-10-CM | POA: Diagnosis not present

## 2018-03-17 DIAGNOSIS — I10 Essential (primary) hypertension: Secondary | ICD-10-CM | POA: Diagnosis not present

## 2018-03-17 DIAGNOSIS — E119 Type 2 diabetes mellitus without complications: Secondary | ICD-10-CM | POA: Insufficient documentation

## 2018-03-17 NOTE — ED Notes (Signed)
Patient verbalizes understanding of discharge instructions. Opportunity for questioning and answers were provided. Armband removed by staff, pt discharged from ED.  

## 2018-03-17 NOTE — ED Notes (Signed)
Patient transported to X-ray 

## 2018-03-17 NOTE — ED Notes (Signed)
Patient refused discharge vital signs. 

## 2018-03-17 NOTE — ED Notes (Signed)
Patient transported to restroom via wheelchair.

## 2018-03-17 NOTE — ED Triage Notes (Signed)
Patient complains of back pain around stimulator disc in back x 2 days, describes as soreness

## 2018-03-17 NOTE — Discharge Instructions (Addendum)
Return here as needed. Tylenol and motrin for pain

## 2018-03-20 NOTE — ED Provider Notes (Signed)
[name] EMERGENCY DEPARTMENT Provider Note   CSN: [id number] Arrival date & time: 03/17/18  6237     History   Chief Complaint No chief complaint on file.   HPI Suzanne Faulkner is a 77 y.o. female.  HPI Patient presents to the emergency department with pain in the area of her nerve stimulator.  The patient states that she feels intermittent sharp pain around the area of the nerve stimulator.  The patient states that this started yesterday.  The patient states she was concerned there is some issue with the stimulator.  Patient states she has had no other symptoms.  Patient states that she did take Aleve with no relief of her symptoms.  The patient denies chest pain, shortness of breath, headache,blurred vision, neck pain, fever, cough, weakness, numbness, dizziness, anorexia, edema, abdominal pain, nausea, vomiting, diarrhea, rash, dysuria, hematemesis, bloody stool, near syncope, or syncope. Past Medical History:  Diagnosis Date  . Arthritis   . Colon polyps 2009, 2014   TUBULAR ADENOMA  . Diabetes mellitus without complication ([place])    Type II  . History of kidney stones    passed  . Hypertension   . Nocturia     Patient Active Problem List   Diagnosis Date Noted  . Lumbar stenosis with neurogenic claudication 06/19/2017  . Lumbar spinal stenosis 10/23/2016  . Low back pain radiating to left leg 10/22/2016  . Thyroid nodule 07/01/2016  . Sternoclavicular joint pain, right 06/02/2016  . Chronic back pain 05/02/2016  . Hyperlipidemia 04/04/2015  . Personal history of colonic polyps 02/02/2013  . TIA (transient ischemic attack) 01/25/2013  . HTN (hypertension) 01/25/2013  . Tobacco abuse, in remission 01/25/2013  . DM type 2 (diabetes mellitus, type 2) ([place]) 01/25/2013  . Bradycardia 01/25/2013    Past Surgical History:  Procedure Laterality Date  . ABDOMINAL HYSTERECTOMY    . CHOLECYSTECTOMY    . COLONOSCOPY  2009, 2014   Dr Bary Castilla  .  COLONOSCOPY WITH PROPOFOL N/A 03/03/2018   Procedure: COLONOSCOPY WITH PROPOFOL;  Surgeon: Robert Bellow, MD;  Location: [facility] ENDOSCOPY;  Service: Endoscopy;  Laterality: N/A;  . goiter removal    . JOINT REPLACEMENT Left    knee  . LUMBAR LAMINECTOMY/DECOMPRESSION MICRODISCECTOMY N/A 06/19/2017   Procedure: LUMBAR LAMINECTOMYLUMBAR 4- LUMBAR 5, KYPHOPLASTY LUMBAR 4- LUMBAR 5;  Surgeon: Ashok Pall, MD;  Location: [place];  Service: Neurosurgery;  Laterality: N/A;  LUMBAR LAMINECTOMYLUMBAR 4- LUMBAR 5, KYPHOPLASTY LUMBAR 4- LUMBAR  . SPINAL CORD STIMULATOR INSERTION N/A 03/21/2016   Procedure: LUMBAR SPINAL CORD STIMULATOR INSERTION;  Surgeon: Clydell Hakim, MD;  Location: [facility];  Service: Neurosurgery;  Laterality: N/A;  . TONSILLECTOMY       OB History    Gravida  2   Para  2   Term      Preterm      AB      Living        SAB      TAB      Ectopic      Multiple      Live Births           Obstetric Comments  1st Menstrual Cycle:  15  1st Pregnancy:  21          Home Medications    Prior to Admission medications   Medication Sig Start Date End Date Taking? Authorizing Provider  Ascorbic Acid (VITAMIN C PO) Take 1 tablet by mouth every morning.  Yes [provider]  aspirin 325 MG tablet Take 325 mg by mouth daily. 01/26/13  Yes Rai, Ripudeep K, MD  b complex vitamins tablet Take 1 tablet by mouth every morning.    Yes [provider]  Calcium Carbonate-Vitamin D (CALCIUM + D PO) Take 1 tablet by mouth every morning.    Yes [provider]  hydrochlorothiazide (MICROZIDE) 12.5 MG capsule TAKE ONE CAPSULE BY MOUTH ONCE DAILY 03/31/17  Yes Birdie Sons, MD  lisinopril (PRINIVIL,ZESTRIL) 10 MG tablet Take 1 tablet (10 mg total) by mouth daily. 09/10/17  Yes Birdie Sons, MD  metFORMIN (GLUCOPHAGE) 500 MG tablet TAKE 1 TABLET BY MOUTH TWICE DAILY 03/04/18  Yes Birdie Sons, MD  simvastatin (ZOCOR) 20 MG tablet Take 1  tablet (20 mg total) by mouth at bedtime. 09/10/17  Yes Birdie Sons, MD  amoxicillin (AMOXIL) 500 MG capsule Take 1 capsule (500 mg total) by mouth as directed. Take all 4 caps on the morning of your colonoscopy 1 hour prior to leaving your house. Patient not taking: Reported on 03/03/2018 02/18/18   Robert Bellow, MD  naproxen (NAPROSYN) 500 MG tablet Take 1 tablet (500 mg total) by mouth 2 (two) times daily with a meal. Patient not taking: Reported on 02/18/2018 09/11/17   Birdie Sons, MD    Family History Family History  Problem Relation Age of Onset  . Heart disease Mother   . Diabetes Mother   . Cancer Father   . Cancer Sister        breast  . Breast cancer Sister   . Cancer Brother 60       pancreatic cancer  . Stomach cancer Brother   . Bone cancer Brother     Social History Social History   Tobacco Use  . Smoking status: Former Smoker    Packs/day: 0.75    Years: 47.00    Pack years: 35.25    Types: Cigarettes    Last attempt to quit: 01/08/2014    Years since quitting: 4.1  . Smokeless tobacco: Never Used  . Tobacco comment: Smoked 1/2-1 ppd since age 61 Quit May 2015  Substance Use Topics  . Alcohol use: No  . Drug use: No     Allergies   Patient has no known allergies.   Review of Systems Review of Systems All other systems negative except as documented in the HPI. All pertinent positives and negatives as reviewed in the HPI.  Physical Exam Updated Vital Signs BP (!) 162/82   Pulse (!) 55   Temp (!) 97.2 F (36.2 C) (Oral)   Resp 18   SpO2 98%   Physical Exam  Constitutional: She is oriented to person, place, and time. She appears well-developed and well-nourished. No distress.  HENT:  Head: Normocephalic and atraumatic.  Eyes: Pupils are equal, round, and reactive to light.  Pulmonary/Chest: Effort normal.  Musculoskeletal:       Back:  Neurological: She is alert and oriented to person, place, and time.  Skin: Skin is warm and  dry.  Psychiatric: She has a normal mood and affect.  Nursing note and vitals reviewed.    ED Treatments / Results  Labs (all labs ordered are listed, but only abnormal results are displayed) Labs Reviewed - No data to display  EKG None  Radiology No results found.  Procedures Procedures (including critical care time)  Medications Ordered in ED Medications - No data to display   Initial  Impression / Assessment and Plan / ED Course  I have reviewed the triage vital signs and the nursing notes.  Pertinent labs & imaging results that were available during my care of the patient were reviewed by me and considered in my medical decision making (see chart for details).     Patient is referred back to the person who manages her stimulator.  Told to return here as needed.  I do not see any comp getting factor with this.  I have asked the patient return here for any worsening in her condition.  Final Clinical Impressions(s) / ED Diagnoses   Final diagnoses:  Acute left-sided low back pain without sciatica    ED Discharge Orders    None       Dalia Heading, PA-C 03/20/18 1549    Blanchie Dessert, MD 03/21/18 0015

## 2018-04-06 ENCOUNTER — Other Ambulatory Visit: Payer: Self-pay | Admitting: Family Medicine

## 2018-04-19 ENCOUNTER — Ambulatory Visit (INDEPENDENT_AMBULATORY_CARE_PROVIDER_SITE_OTHER): Payer: Medicare Other

## 2018-04-19 ENCOUNTER — Ambulatory Visit (INDEPENDENT_AMBULATORY_CARE_PROVIDER_SITE_OTHER): Payer: Medicare Other | Admitting: Family Medicine

## 2018-04-19 VITALS — BP 122/68 | HR 61 | Temp 98.2°F | Ht 66.0 in | Wt 266.2 lb

## 2018-04-19 DIAGNOSIS — E785 Hyperlipidemia, unspecified: Secondary | ICD-10-CM

## 2018-04-19 DIAGNOSIS — Z23 Encounter for immunization: Secondary | ICD-10-CM | POA: Diagnosis not present

## 2018-04-19 DIAGNOSIS — Z0000 Encounter for general adult medical examination without abnormal findings: Secondary | ICD-10-CM

## 2018-04-19 DIAGNOSIS — E119 Type 2 diabetes mellitus without complications: Secondary | ICD-10-CM | POA: Diagnosis not present

## 2018-04-19 DIAGNOSIS — I10 Essential (primary) hypertension: Secondary | ICD-10-CM | POA: Diagnosis not present

## 2018-04-19 MED ORDER — METFORMIN HCL 500 MG PO TABS: 500.0000 mg | ORAL_TABLET | Freq: Every day | ORAL | 0 refills | Status: AC

## 2018-04-19 NOTE — Progress Notes (Signed)
Subjective:   Suzanne Faulkner is a 77 y.o. female who presents for Medicare Annual (Subsequent) preventive examination.  Review of Systems:  N/A  Cardiac Risk Factors include: advanced age (>56men, >76 women);diabetes mellitus;dyslipidemia;hypertension;obesity (BMI >30kg/m2)     Objective:     Vitals: BP 122/68 (BP Location: Right Arm)   Pulse 61   Temp 98.2 F (36.8 C) (Oral)   Ht 5\' 6"  (1.676 m)   Wt 266 lb 3.2 oz (120.7 kg)   BMI 42.97 kg/m   Body mass index is 42.97 kg/m.  Advanced Directives 04/19/2018 03/17/2018 03/03/2018 06/20/2017 03/15/2017 01/04/2017 11/12/2016  Does Patient Have a Medical Advance Directive? No No No No No No No  Would patient like information on creating a medical advance directive? Yes (MAU/Ambulatory/Procedural Areas - Information given) No - Patient declined No - Patient declined No - Patient declined - No - Patient declined No - Patient declined    Tobacco Social History   Tobacco Use  Smoking Status Former Smoker  . Packs/day: 0.75  . Years: 47.00  . Pack years: 35.25  . Types: Cigarettes  . Last attempt to quit: 01/08/2014  . Years since quitting: 4.2  Smokeless Tobacco Never Used  Tobacco Comment   Smoked 1/2-1 ppd since age 54 Quit May 2015     Counseling given: Not Answered Comment: Smoked 1/2-1 ppd since age 33 Quit May 2015   Clinical Intake:  Pre-visit preparation completed: Yes  Pain : No/denies pain Pain Score: 0-No pain     Nutritional Status: BMI > 30  Obese Nutritional Risks: None Diabetes: Yes(type 2) CBG done?: No Did pt. bring in CBG monitor from home?: No  How often do you need to have someone help you when you read instructions, pamphlets, or other written materials from your doctor or pharmacy?: 1 - Never  Interpreter Needed?: No  Information entered by :: [facility], LPN  Past Medical History:  Diagnosis Date  . Arthritis   . Colon polyps 2009, 2014   TUBULAR ADENOMA  . Diabetes mellitus without  complication ([place])    Type II  . History of kidney stones    passed  . Hypertension   . Nocturia    Past Surgical History:  Procedure Laterality Date  . ABDOMINAL HYSTERECTOMY    . CHOLECYSTECTOMY    . COLONOSCOPY  2009, 2014   Dr Bary Castilla  . COLONOSCOPY WITH PROPOFOL N/A 03/03/2018   Procedure: COLONOSCOPY WITH PROPOFOL;  Surgeon: Robert Bellow, MD;  Location: [facility] ENDOSCOPY;  Service: Endoscopy;  Laterality: N/A;  . goiter removal    . JOINT REPLACEMENT Left    knee  . LUMBAR LAMINECTOMY/DECOMPRESSION MICRODISCECTOMY N/A 06/19/2017   Procedure: LUMBAR LAMINECTOMYLUMBAR 4- LUMBAR 5, KYPHOPLASTY LUMBAR 4- LUMBAR 5;  Surgeon: Ashok Pall, MD;  Location: [place];  Service: Neurosurgery;  Laterality: N/A;  LUMBAR LAMINECTOMYLUMBAR 4- LUMBAR 5, KYPHOPLASTY LUMBAR 4- LUMBAR  . SPINAL CORD STIMULATOR INSERTION N/A 03/21/2016   Procedure: LUMBAR SPINAL CORD STIMULATOR INSERTION;  Surgeon: Clydell Hakim, MD;  Location: [place] [facility];  Service: Neurosurgery;  Laterality: N/A;  . TONSILLECTOMY     Family History  Problem Relation Age of Onset  . Heart disease Mother   . Diabetes Mother   . Cancer Father   . Cancer Sister        breast  . Breast cancer Sister   . Cancer Brother 60       pancreatic cancer  . Stomach cancer Brother   . Bone  cancer Brother    Social History   Socioeconomic History  . Marital status: Divorced    Spouse name: Not on file  . Number of children: 2  . Years of education: Not on file  . Highest education level: High school graduate  Occupational History  . Occupation: disabled/retired  Social Needs  . Financial resource strain: Not hard at all  . Food insecurity:    Worry: Never true    Inability: Never true  . Transportation needs:    Medical: No    Non-medical: No  Tobacco Use  . Smoking status: Former Smoker    Packs/day: 0.75    Years: 47.00    Pack years: 35.25    Types: Cigarettes    Last attempt to quit: 01/08/2014    Years since  quitting: 4.2  . Smokeless tobacco: Never Used  . Tobacco comment: Smoked 1/2-1 ppd since age 49 Quit May 2015  Substance and Sexual Activity  . Alcohol use: No  . Drug use: No  . Sexual activity: Not on file  Lifestyle  . Physical activity:    Days per week: Not on file    Minutes per session: Not on file  . Stress: To some extent  Relationships  . Social connections:    Talks on phone: Not on file    Gets together: Not on file    Attends religious service: Not on file    Active member of club or organization: Not on file    Attends meetings of clubs or organizations: Not on file    Relationship status: Not on file  Other Topics Concern  . Not on file  Social History Narrative  . Not on file    Outpatient Encounter Medications as of 04/19/2018  Medication Sig  . Ascorbic Acid (VITAMIN C PO) Take 1 tablet by mouth every morning.  Marland Kitchen aspirin 325 MG tablet Take 325 mg by mouth daily.  Marland Kitchen b complex vitamins tablet Take 1 tablet by mouth every morning.   . Calcium Carbonate-Vitamin D (CALCIUM + D PO) Take 1 tablet by mouth every morning.   . hydrochlorothiazide (MICROZIDE) 12.5 MG capsule TAKE ONE CAPSULE BY MOUTH ONCE DAILY  . lisinopril (PRINIVIL,ZESTRIL) 10 MG tablet Take 1 tablet (10 mg total) by mouth daily.  . metFORMIN (GLUCOPHAGE) 500 MG tablet TAKE 1 TABLET BY MOUTH TWICE DAILY (Patient taking differently: Take 500 mg by mouth daily with breakfast. )  . naproxen (NAPROSYN) 500 MG tablet Take 1 tablet (500 mg total) by mouth 2 (two) times daily with a meal. (Patient taking differently: Take 500 mg by mouth as needed. )  . simvastatin (ZOCOR) 20 MG tablet TAKE 1 TABLET BY MOUTH AT BEDTIME  . amoxicillin (AMOXIL) 500 MG capsule Take 1 capsule (500 mg total) by mouth as directed. Take all 4 caps on the morning of your colonoscopy 1 hour prior to leaving your house. (Patient not taking: Reported on 03/03/2018)   No facility-administered encounter medications on file as of 04/19/2018.      Activities of Daily Living In your present state of health, do you have any difficulty performing the following activities: 04/19/2018 06/20/2017  Hearing? N -  Vision? Y -  Comment Only in AM after waking up. Pt has a film over eye. Pt is f/u with Dr Gloriann Loan.  -  Difficulty concentrating or making decisions? N -  Walking or climbing stairs? Y -  Comment Due to back pain.  -  Dressing or bathing?  N -  Doing errands, shopping? N N  Preparing Food and eating ? N -  Using the Toilet? N -  In the past six months, have you accidently leaked urine? Y -  Comment Occasionally, when cant make it to the bathroom on time.  -  Do you have problems with loss of bowel control? N -  Managing your Medications? N -  Managing your Finances? N -  Housekeeping or managing your Housekeeping? N -  Some recent data might be hidden    Patient Care Team: Birdie Sons, MD as PCP - General (Family Medicine) Lorelee Cover., MD as Consulting Physician (Ophthalmology) Clydell Hakim, MD as Consulting Physician (Anesthesiology) Ashok Pall, MD as Consulting Physician (Neurosurgery)    Assessment:   This is a routine wellness examination for [facility].  Exercise Activities and Dietary recommendations Current Exercise Habits: The patient does not participate in regular exercise at present, Exercise limited by: orthopedic condition(s)  Goals    . DIET - REDUCE SUGAR INTAKE     Recommend to cut back on sweets or high sugar foods and substitute with fruits, vegetables or protein.     . Increase water intake     Starting 06/24/16, I will continue to drink 4 glasses a day.       Fall Risk Fall Risk  04/19/2018 10/09/2017 06/24/2016 04/30/2016 04/19/2015  Falls in the past year? No No No No Yes  Number falls in past yr: - - - - 1  Injury with Fall? - - - - No   Is the patient's home free of loose throw rugs in walkways, pet beds, electrical cords, etc?   yes      Grab bars in the bathroom? no      Handrails  on the stairs?   no      Adequate lighting?   yes  Timed Get Up and Go performed: N/A  Depression Screen PHQ 2/9 Scores 04/19/2018 10/09/2017 06/24/2016 04/30/2016  PHQ - 2 Score 0 0 0 0     Cognitive Function:      6CIT Screen 04/19/2018 06/24/2016  What Year? 0 points 0 points  What month? 0 points 0 points  What time? 0 points 0 points  Count back from 20 0 points 0 points  Months in reverse 0 points 0 points  Repeat phrase 2 points 2 points  Total Score 2 2    Immunization History  Administered Date(s) Administered  . Influenza Split 07/18/2011, 07/21/2012  . Influenza, High Dose Seasonal PF 05/24/2015, 04/30/2016, 05/02/2017, 04/19/2018  . Influenza,inj,Quad PF,6+ Mos 04/29/2013, 04/28/2014  . Pneumococcal Conjugate-13 04/28/2014  . Pneumococcal Polysaccharide-23 08/15/2010    Qualifies for Shingles Vaccine? Due for Shingles vaccine. Declined my offer to administer today. Education has been provided regarding the importance of this vaccine. Pt has been advised to call her insurance company to determine her out of pocket expense. Advised she may also receive this vaccine at her local pharmacy or Health Dept. Verbalized acceptance and understanding.  Screening Tests Health Maintenance  Topic Date Due  . HEMOGLOBIN A1C  04/11/2018  . TETANUS/TDAP  05/24/2026 (Originally 10/30/1959)  . FOOT EXAM  04/23/2018  . OPHTHALMOLOGY EXAM  02/09/2019  . COLONOSCOPY  03/04/2023  . INFLUENZA VACCINE  Completed  . DEXA SCAN  Completed  . PNA vac Low Risk Adult  Completed    Cancer Screenings: Lung: Low Dose CT Chest recommended if Age 59-80 years, 30 pack-year currently smoking OR have quit w/in  15years. Patient does qualify. An Epic message has been sent to Burgess Estelle, RN (Oncology Nurse Navigator) regarding the possible need for this exam. Raquel Sarna will review the patient's chart to determine if the patient truly qualifies for the exam. If the patient qualifies, Raquel Sarna will order the Low  Dose CT of the chest to facilitate the scheduling of this exam. Breast:  Up to date on Mammogram? Yes   Up to date of Bone Density/Dexa? Yes Colorectal: Up to date  Additional Screenings:  Hepatitis C Screening: N/A     Plan:  I have personally reviewed and addressed the Medicare Annual Wellness questionnaire and have noted the following in the patient's chart:  A. Medical and social history B. Use of alcohol, tobacco or illicit drugs  C. Current medications and supplements D. Functional ability and status E.  Nutritional status F.  Physical activity G. Advance directives H. List of other physicians I.  Hospitalizations, surgeries, and ER visits in previous 12 months J.  [place] such as hearing and vision if needed, cognitive and depression L. Referrals and appointments - none  In addition, I have reviewed and discussed with patient certain preventive protocols, quality metrics, and best practice recommendations. A written personalized care plan for preventive services as well as general preventive health recommendations were provided to patient.  See attached scanned questionnaire for additional information.   Signed,  Fabio Neighbors, LPN Nurse Health Advisor   Nurse Recommendations: Pt needs her Hgb A1c checked today. Pt declined the tetanus vaccine today.

## 2018-04-19 NOTE — Patient Instructions (Signed)
   The CDC recommends two doses of Shingrix (the shingles vaccine) separated by 2 to 6 months for adults age 77 years and older. I recommend checking with your pharmacy plan regarding coverage for this vaccine.     

## 2018-04-19 NOTE — Progress Notes (Signed)
Patient: Suzanne Faulkner Female    DOB: 03-31-1941   77 y.o.   MRN: [id number] Visit Date: 04/19/2018  Today's Provider: Lelon Huh, MD   Chief Complaint  Patient presents with  . Diabetes  . Hypertension  . Hyperlipidemia   Subjective:   Patient saw McKenzie today for AWV at 9:20 am.  HPI   Diabetes Mellitus Type II, Follow-up:   Lab Results  Component Value Date   HGBA1C 5.8 10/09/2017   HGBA1C 5.6 04/23/2017   HGBA1C 6.0 10/22/2016   Last seen for diabetes 6 months ago.  Management since then includes; no changes. She reports good compliance with treatment. Patient states her Metformin is supposed to be once a day instead of twice a day. She is not having side effects.  Current symptoms include none and have been stable. Home blood sugar records: blood sugars are not checked  Episodes of hypoglycemia? no   Current Insulin Regimen: none Most Recent Eye Exam: 02/08/2018 Weight trend: fluctuating a bit Prior visit with dietician: no Current diet: in general, an "unhealthy" diet Current exercise: none  ---------------------------------------------------------------  Hypertension, follow-up:  BP Readings from Last 3 Encounters:  04/19/18 122/68  03/17/18 (!) 162/82  03/03/18 108/69    She was last seen for hypertension 6 months ago.  BP at that visit was 120/70. Management since that visit includes; no changes.She reports good compliance with treatment. She is not having side effects.  She is not exercising. She is adherent to low salt diet.   Outside blood pressures are not being checked. She is experiencing none.  Patient denies chest pain, chest pressure/discomfort, claudication, dyspnea, exertional chest pressure/discomfort, fatigue, irregular heart beat, lower extremity edema, near-syncope, orthopnea, palpitations, paroxysmal nocturnal dyspnea, syncope and tachypnea.   Cardiovascular risk factors include advanced age (older than 29 for men, 62  for women), diabetes mellitus, hypertension, obesity (BMI >= 30 kg/m2) and sedentary lifestyle.  Use of agents associated with hypertension: NSAIDS.   ---------------------------------------------------------------    Lipid/Cholesterol, Follow-up:   Last seen for this 6 months ago.  Management since that visit includes; no changes.  Last Lipid Panel:    Component Value Date/Time   CHOL 192 04/23/2017 1152   CHOL 132 05/05/2016 0920   TRIG 95 04/23/2017 1152   HDL 78 04/23/2017 1152   HDL 61 05/05/2016 0920   CHOLHDL 2.5 04/23/2017 1152   VLDL 25 01/26/2013 0543   LDLCALC 95 04/23/2017 1152    She reports good compliance with treatment. She is not having side effects.   Wt Readings from Last 3 Encounters:  04/19/18 266 lb 3.2 oz (120.7 kg)  03/03/18 261 lb (118.4 kg)  02/18/18 261 lb (118.4 kg)    ---------------------------------------------------------------    No Known Allergies   Current Outpatient Medications:  .  Ascorbic Acid (VITAMIN C PO), Take 1 tablet by mouth every morning., Disp: , Rfl:  .  aspirin 325 MG tablet, Take 325 mg by mouth daily., Disp: , Rfl:  .  b complex vitamins tablet, Take 1 tablet by mouth every morning. , Disp: , Rfl:  .  Calcium Carbonate-Vitamin D (CALCIUM + D PO), Take 1 tablet by mouth every morning. , Disp: , Rfl:  .  hydrochlorothiazide (MICROZIDE) 12.5 MG capsule, TAKE ONE CAPSULE BY MOUTH ONCE DAILY, Disp: 180 capsule, Rfl: 4 .  lisinopril (PRINIVIL,ZESTRIL) 10 MG tablet, Take 1 tablet (10 mg total) by mouth daily., Disp: 30 tablet, Rfl: 3 .  metFORMIN (GLUCOPHAGE) 500  MG tablet, TAKE 1 TABLET BY MOUTH TWICE DAILY (Patient taking differently: Take 500 mg by mouth daily with breakfast. ), Disp: 180 tablet, Rfl: 3 .  naproxen (NAPROSYN) 500 MG tablet, Take 1 tablet (500 mg total) by mouth 2 (two) times daily with a meal. (Patient taking differently: Take 500 mg by mouth as needed. ), Disp: 30 tablet, Rfl: 1 .  simvastatin (ZOCOR)  20 MG tablet, TAKE 1 TABLET BY MOUTH AT BEDTIME, Disp: 30 tablet, Rfl: 12   Review of Systems  Constitutional: Negative for chills, fatigue and fever.  HENT: Negative for congestion, ear pain, rhinorrhea, sneezing and sore throat.   Eyes: Negative.  Negative for pain and redness.  Respiratory: Negative for cough, shortness of breath and wheezing.   Cardiovascular: Negative for chest pain and leg swelling.  Gastrointestinal: Negative for abdominal pain, blood in stool, constipation, diarrhea and nausea.  Endocrine: Negative for polydipsia and polyphagia.  Genitourinary: Positive for dysuria. Negative for flank pain, hematuria, pelvic pain, vaginal bleeding and vaginal discharge.  Musculoskeletal: Positive for back pain. Negative for arthralgias, gait problem and joint swelling.  Skin: Negative for rash.  Neurological: Negative.  Negative for dizziness, tremors, seizures, weakness, light-headedness, numbness and headaches.  Hematological: Negative for adenopathy.  Psychiatric/Behavioral: Negative.  Negative for behavioral problems, confusion and dysphoric mood. The patient is not nervous/anxious and is not hyperactive.     Social History   Tobacco Use  . Smoking status: Former Smoker    Packs/day: 0.75    Years: 47.00    Pack years: 35.25    Types: Cigarettes    Last attempt to quit: 01/08/2014    Years since quitting: 4.2  . Smokeless tobacco: Never Used  . Tobacco comment: Smoked 1/2-1 ppd since age 75 Quit May 2015  Substance Use Topics  . Alcohol use: No   Objective:      BP 122/68 (BP Location: Right Arm)   Pulse 61   Temp 98.2 F (36.8 C) (Oral)   Ht 5\' 6"  (1.676 m)   Wt 266 lb 3.2 oz (120.7 kg)   BMI 42.97 kg/m   BSA 2.37 m   Pain [place] 0-No pain     Physical Exam   General Appearance:    Alert, cooperative, no distress  Eyes:    PERRL, conjunctiva/corneas clear, EOM's intact       Lungs:     Clear to auscultation bilaterally, respirations unlabored  Heart:     Regular rate and rhythm  Neurologic:   Awake, alert, oriented x 3. No apparent focal neurological           defect.           Assessment & Plan:     1. Type 2 diabetes mellitus without complication, without long-term current use of insulin (HCC) Doing well on current medications.  - metFORMIN (GLUCOPHAGE) 500 MG tablet; Take 1 tablet (500 mg total) by mouth daily with breakfast.  Dispense: 3 tablet; Refill: 0 - Comprehensive metabolic panel - [id number]  2. Essential hypertension Well controlled.  Continue current medications.    3. Hyperlipidemia, unspecified hyperlipidemia type She is tolerating simvastatin well with no adverse effects.   - Comprehensive metabolic panel - Lipid panel       Lelon Huh, MD  [facility]

## 2018-04-19 NOTE — Patient Instructions (Signed)
Ms. Suzanne Faulkner , Thank you for taking time to come for your Medicare Wellness Visit. I appreciate your ongoing commitment to your health goals. Please review the following plan we discussed and let me know if I can assist you in the future.   Screening recommendations/referrals: Colonoscopy: Up to date Mammogram: Up to date Bone Density: Up to date Recommended yearly ophthalmology/optometry visit for glaucoma screening and checkup Recommended yearly dental visit for hygiene and checkup  Vaccinations: Influenza vaccine: Up to date Pneumococcal vaccine: Up to date Tdap vaccine: Pt declines today.  Shingles vaccine: Pt declines today.     Advanced directives: Advance directive discussed with you today. I have provided a copy for you to complete at home and have notarized. Once this is complete please bring a copy in to our office so we can scan it into your chart.  Conditions/risks identified: Obesity- recommend to cut back on sweets or high sugar foods and substitute with fruits, vegetables or protein.   Next appointment: 10:00 AM today with Dr Caryn Section.   Preventive Care 27 Years and Older, Female Preventive care refers to lifestyle choices and visits with your health care provider that can promote health and wellness. What does preventive care include?  A yearly physical exam. This is also called an annual well check.  Dental exams once or twice a year.  Routine eye exams. Ask your health care provider how often you should have your eyes checked.  Personal lifestyle choices, including:  Daily care of your teeth and gums.  Regular physical activity.  Eating a healthy diet.  Avoiding tobacco and drug use.  Limiting alcohol use.  Practicing safe sex.  Taking low-dose aspirin every day.  Taking vitamin and mineral supplements as recommended by your health care provider. What happens during an annual well check? The services and screenings done by your health care provider  during your annual well check will depend on your age, overall health, lifestyle risk factors, and family history of disease. Counseling  Your health care provider may ask you questions about your:  Alcohol use.  Tobacco use.  Drug use.  Emotional well-being.  Home and relationship well-being.  Sexual activity.  Eating habits.  History of falls.  Memory and ability to understand (cognition).  Work and work Statistician.  Reproductive health. Screening  You may have the following tests or measurements:  Height, weight, and BMI.  Blood pressure.  Lipid and cholesterol levels. These may be checked every 5 years, or more frequently if you are over 19 years old.  Skin check.  Lung cancer screening. You may have this screening every year starting at age 55 if you have a 30-pack-year history of smoking and currently smoke or have quit within the past 15 years.  Fecal occult blood test (FOBT) of the stool. You may have this test every year starting at age 65.  Flexible sigmoidoscopy or colonoscopy. You may have a sigmoidoscopy every 5 years or a colonoscopy every 10 years starting at age 80.  Hepatitis C blood test.  Hepatitis B blood test.  Sexually transmitted disease (STD) testing.  Diabetes screening. This is done by checking your blood sugar (glucose) after you have not eaten for a while (fasting). You may have this done every 1-3 years.  Bone density scan. This is done to screen for osteoporosis. You may have this done starting at age 22.  Mammogram. This may be done every 1-2 years. Talk to your health care provider about how often you should  have regular mammograms. Talk with your health care provider about your test results, treatment options, and if necessary, the need for more tests. Vaccines  Your health care provider may recommend certain vaccines, such as:  Influenza vaccine. This is recommended every year.  Tetanus, diphtheria, and acellular pertussis  (Tdap, Td) vaccine. You may need a Td booster every 10 years.  Zoster vaccine. You may need this after age 44.  Pneumococcal 13-valent conjugate (PCV13) vaccine. One dose is recommended after age 62.  Pneumococcal polysaccharide (PPSV23) vaccine. One dose is recommended after age 66. Talk to your health care provider about which screenings and vaccines you need and how often you need them. This information is not intended to replace advice given to you by your health care provider. Make sure you discuss any questions you have with your health care provider. Document Released: 08/24/2015 Document Revised: 04/16/2016 Document Reviewed: 05/29/2015 Elsevier Interactive Patient Education  2017 [place] Prevention in the Home Falls can cause injuries. They can happen to people of all ages. There are many things you can do to make your home safe and to help prevent falls. What can I do on the outside of my home?  Regularly fix the edges of walkways and driveways and fix any cracks.  Remove anything that might make you trip as you walk through a door, such as a raised step or threshold.  Trim any bushes or trees on the path to your home.  Use bright outdoor lighting.  Clear any walking paths of anything that might make someone trip, such as rocks or tools.  Regularly check to see if handrails are loose or broken. Make sure that both sides of any steps have handrails.  Any raised decks and porches should have guardrails on the edges.  Have any leaves, snow, or ice cleared regularly.  Use sand or salt on walking paths during winter.  Clean up any spills in your garage right away. This includes oil or grease spills. What can I do in the bathroom?  Use night lights.  Install grab bars by the toilet and in the tub and shower. Do not use towel bars as grab bars.  Use non-skid mats or decals in the tub or shower.  If you need to sit down in the shower, use a plastic, non-slip  stool.  Keep the floor dry. Clean up any water that spills on the floor as soon as it happens.  Remove soap buildup in the tub or shower regularly.  Attach bath mats securely with double-sided non-slip rug tape.  Do not have throw rugs and other things on the floor that can make you trip. What can I do in the bedroom?  Use night lights.  Make sure that you have a light by your bed that is easy to reach.  Do not use any sheets or blankets that are too big for your bed. They should not hang down onto the floor.  Have a firm chair that has side arms. You can use this for support while you get dressed.  Do not have throw rugs and other things on the floor that can make you trip. What can I do in the kitchen?  Clean up any spills right away.  Avoid walking on wet floors.  Keep items that you use a lot in easy-to-reach places.  If you need to reach something above you, use a strong step stool that has a grab bar.  Keep electrical cords out of  the way.  Do not use floor polish or wax that makes floors slippery. If you must use wax, use non-skid floor wax.  Do not have throw rugs and other things on the floor that can make you trip. What can I do with my stairs?  Do not leave any items on the stairs.  Make sure that there are handrails on both sides of the stairs and use them. Fix handrails that are broken or loose. Make sure that handrails are as long as the stairways.  Check any carpeting to make sure that it is firmly attached to the stairs. Fix any carpet that is loose or worn.  Avoid having throw rugs at the top or bottom of the stairs. If you do have throw rugs, attach them to the floor with carpet tape.  Make sure that you have a light switch at the top of the stairs and the bottom of the stairs. If you do not have them, ask someone to add them for you. What else can I do to help prevent falls?  Wear shoes that:  Do not have high heels.  Have rubber bottoms.  Are  comfortable and fit you well.  Are closed at the toe. Do not wear sandals.  If you use a stepladder:  Make sure that it is fully opened. Do not climb a closed stepladder.  Make sure that both sides of the stepladder are locked into place.  Ask someone to hold it for you, if possible.  Clearly mark and make sure that you can see:  Any grab bars or handrails.  First and last steps.  Where the edge of each step is.  Use tools that help you move around (mobility aids) if they are needed. These include:  Canes.  Walkers.  Scooters.  Crutches.  Turn on the lights when you go into a dark area. Replace any light bulbs as soon as they burn out.  Set up your furniture so you have a clear path. Avoid moving your furniture around.  If any of your floors are uneven, fix them.  If there are any pets around you, be aware of where they are.  Review your medicines with your doctor. Some medicines can make you feel dizzy. This can increase your chance of falling. Ask your doctor what other things that you can do to help prevent falls. This information is not intended to replace advice given to you by your health care provider. Make sure you discuss any questions you have with your health care provider. Document Released: 05/24/2009 Document Revised: 01/03/2016 Document Reviewed: 09/01/2014 Elsevier Interactive Patient Education  2017 [employer].

## 2018-04-20 LAB — COMPREHENSIVE METABOLIC PANEL
ALK PHOS: 85 IU/L (ref 39–117)
ALT: 13 IU/L (ref 0–32)
AST: 18 IU/L (ref 0–40)
Albumin/Globulin Ratio: 1.6 (ref 1.2–2.2)
Albumin: 4.3 g/dL (ref 3.5–4.8)
BILIRUBIN TOTAL: 0.7 mg/dL (ref 0.0–1.2)
BUN/Creatinine Ratio: 12 (ref 12–28)
BUN: 19 mg/dL (ref 8–27)
CHLORIDE: 103 mmol/L (ref 96–106)
CO2: 26 mmol/L (ref 20–29)
CREATININE: 1.6 mg/dL — AB (ref 0.57–1.00)
Calcium: 10.5 mg/dL — ABNORMAL HIGH (ref 8.7–10.3)
GFR calc Af Amer: 36 mL/min/{1.73_m2} — ABNORMAL LOW (ref >59)
GFR calc non Af Amer: 31 mL/min/{1.73_m2} — ABNORMAL LOW (ref >59)
GLUCOSE: 139 mg/dL — AB (ref 65–99)
Globulin, Total: 2.7 g/dL (ref 1.5–4.5)
Potassium: 4.4 mmol/L (ref 3.5–5.2)
SODIUM: 144 mmol/L (ref 134–144)
Total Protein: 7 g/dL (ref 6.0–8.5)

## 2018-04-20 LAB — LIPID PANEL
CHOLESTEROL TOTAL: 182 mg/dL (ref 100–199)
Chol/HDL Ratio: 3 ratio (ref 0.0–4.4)
HDL: 60 mg/dL (ref >39)
LDL CALC: 92 mg/dL (ref 0–99)
TRIGLYCERIDES: 150 mg/dL — AB (ref 0–149)
VLDL CHOLESTEROL CAL: 30 mg/dL (ref 5–40)

## 2018-04-20 LAB — HEMOGLOBIN A1C
Est. average glucose Bld gHb Est-mCnc: 146 mg/dL
Hgb A1c MFr Bld: 6.7 % — ABNORMAL HIGH (ref 4.8–5.6)

## 2018-06-01 ENCOUNTER — Other Ambulatory Visit: Payer: Self-pay | Admitting: Family Medicine

## 2018-06-01 DIAGNOSIS — I10 Essential (primary) hypertension: Secondary | ICD-10-CM

## 2018-07-10 DIAGNOSIS — J069 Acute upper respiratory infection, unspecified: Secondary | ICD-10-CM | POA: Diagnosis not present

## 2018-07-28 ENCOUNTER — Other Ambulatory Visit: Payer: Self-pay | Admitting: Family Medicine

## 2018-07-28 DIAGNOSIS — Z1231 Encounter for screening mammogram for malignant neoplasm of breast: Secondary | ICD-10-CM

## 2018-08-12 DIAGNOSIS — E113393 Type 2 diabetes mellitus with moderate nonproliferative diabetic retinopathy without macular edema, bilateral: Secondary | ICD-10-CM | POA: Diagnosis not present

## 2018-08-20 ENCOUNTER — Encounter: Payer: Self-pay | Admitting: Family Medicine

## 2018-08-20 ENCOUNTER — Ambulatory Visit (INDEPENDENT_AMBULATORY_CARE_PROVIDER_SITE_OTHER): Payer: Medicare Other | Admitting: Family Medicine

## 2018-08-20 VITALS — BP 128/68 | HR 60 | Temp 97.8°F | Resp 16 | Ht 66.0 in | Wt 265.0 lb

## 2018-08-20 DIAGNOSIS — N183 Chronic kidney disease, stage 3 unspecified: Secondary | ICD-10-CM | POA: Insufficient documentation

## 2018-08-20 DIAGNOSIS — R202 Paresthesia of skin: Secondary | ICD-10-CM | POA: Diagnosis not present

## 2018-08-20 DIAGNOSIS — E119 Type 2 diabetes mellitus without complications: Secondary | ICD-10-CM | POA: Diagnosis not present

## 2018-08-20 DIAGNOSIS — R200 Anesthesia of skin: Secondary | ICD-10-CM | POA: Diagnosis not present

## 2018-08-20 DIAGNOSIS — R430 Anosmia: Secondary | ICD-10-CM | POA: Diagnosis not present

## 2018-08-20 DIAGNOSIS — I10 Essential (primary) hypertension: Secondary | ICD-10-CM | POA: Diagnosis not present

## 2018-08-20 DIAGNOSIS — E785 Hyperlipidemia, unspecified: Secondary | ICD-10-CM

## 2018-08-20 LAB — POCT GLYCOSYLATED HEMOGLOBIN (HGB A1C): Hemoglobin A1C: 6.7 % — AB (ref 4.0–5.6)

## 2018-08-20 NOTE — Patient Instructions (Signed)
.   Please bring all of your medications to every appointment so we can make sure that our medication list is the same as yours.   

## 2018-08-20 NOTE — Progress Notes (Signed)
Patient: Suzanne Faulkner Female    DOB: [birth year]   78 y.o.   MRN: [id number] Visit Date: 08/20/2018  Today's Provider: Lelon Huh, MD   Chief Complaint  Patient presents with  . Hyperlipidemia  . Hypertension  . Diabetes   Subjective:     Cough  The current episode started more than 1 month ago (Pt was at Urgent Care at Thanksgiving she was prescribed an inhaler and nasal spray.). The problem has been unchanged. Pertinent negatives include no chest pain, ear congestion, ear pain, fever, headaches, heartburn, nasal congestion, postnasal drip, rhinorrhea, sore throat or shortness of breath.   Was seen at urgent care after thanksgiving and prescribed inhaler and nasal spray. Nasal drainage has improved, but cough has persisted. Has has noticed not able to smell for the last year.     Diabetes Mellitus Type II, Follow-up:   Lab Results  Component Value Date   HGBA1C 6.7 (H) 04/19/2018   HGBA1C 5.8 10/09/2017   HGBA1C 5.6 04/23/2017   Last seen for diabetes 4 months ago.  Management since then includes No changes. She reports excellent compliance with treatment. She is not having side effects.  Current symptoms include Numbness in feet at night. and have been stable. Home blood sugar records: Pt does not check blood sugars at home.  Episodes of hypoglycemia? no   Current Insulin Regimen: None Most Recent Eye Exam: 08/2018 Weight trend: stable Current diet: in general, an "unhealthy" diet Current exercise: none  She does reports tinging and numb feeling in feet, sometimes wakes her at night.  ------------------------------------------------------------------------   Hypertension, follow-up:  BP Readings from Last 3 Encounters:  08/20/18 128/68  04/19/18 122/68  03/17/18 (!) 162/82    She was last seen for hypertension 4 months ago.  BP at that visit was 122/68. Management since that visit includes No changes in medication, but pt advised to drink more  water secondary to a decline in kidney functions. She reports excellent compliance with treatment. She is not having side effects.  She is not exercising. She is not adherent to low salt diet.   Outside blood pressures are no being checked. She is experiencing cough.  Patient denies chest pain, irregular heart beat, lower extremity edema, near-syncope, palpitations, syncope and tachypnea.   Cardiovascular risk factors include advanced age (older than 102 for men, 72 for women), diabetes mellitus, dyslipidemia, hypertension and obesity (BMI >= 30 kg/m2).  Use of agents associated with hypertension: none.   ------------------------------------------------------------------------    Lipid/Cholesterol, Follow-up:   Last seen for this 4 months ago.  Management since that visit includes no changes.  Last Lipid Panel:    Component Value Date/Time   CHOL 182 04/19/2018 1059   TRIG 150 (H) 04/19/2018 1059   HDL 60 04/19/2018 1059   CHOLHDL 3.0 04/19/2018 1059   CHOLHDL 2.5 04/23/2017 1152   VLDL 25 01/26/2013 0543   LDLCALC 92 04/19/2018 1059   [place] 95 04/23/2017 1152    She reports excellent compliance with treatment. She is not having side effects.   Wt Readings from Last 3 Encounters:  08/20/18 265 lb (120.2 kg)  04/19/18 266 lb 3.2 oz (120.7 kg)  03/03/18 261 lb (118.4 kg)    ------------------------------------------------------------------------  She is also concerned that she is unable smell normally for the last several months, she is not bothered too much by it, but concerned it may be a sign of other medication problems. She didn't notice any improvement in  this when she was using nasal inhaler.   No Known Allergies   Current Outpatient Medications:  .  Ascorbic Acid (VITAMIN C PO), Take 1 tablet by mouth every morning., Disp: , Rfl:  .  aspirin 325 MG tablet, Take 325 mg by mouth daily., Disp: , Rfl:  .  b complex vitamins tablet, Take 1 tablet by mouth every  morning. , Disp: , Rfl:  .  Calcium Carbonate-Vitamin D (CALCIUM + D PO), Take 1 tablet by mouth every morning. , Disp: , Rfl:  .  hydrochlorothiazide (MICROZIDE) 12.5 MG capsule, TAKE 1 CAPSULE BY MOUTH ONCE DAILY, Disp: 90 capsule, Rfl: 4 .  lisinopril (PRINIVIL,ZESTRIL) 10 MG tablet, TAKE 1 TABLET BY MOUTH ONCE DAILY, Disp: 30 tablet, Rfl: 12 .  metFORMIN (GLUCOPHAGE) 500 MG tablet, Take 1 tablet (500 mg total) by mouth daily with breakfast., Disp: 3 tablet, Rfl: 0 .  simvastatin (ZOCOR) 20 MG tablet, TAKE 1 TABLET BY MOUTH AT BEDTIME, Disp: 30 tablet, Rfl: 12 .  naproxen (NAPROSYN) 500 MG tablet, Take 1 tablet (500 mg total) by mouth 2 (two) times daily with a meal. (Patient not taking: Reported on 08/20/2018), Disp: 30 tablet, Rfl: 1  Review of Systems  Constitutional: Negative for fever.  HENT: Negative for ear pain, postnasal drip, rhinorrhea and sore throat.   Respiratory: Positive for cough. Negative for shortness of breath.   Cardiovascular: Negative for chest pain.  Gastrointestinal: Negative for heartburn.  Neurological: Negative for headaches.    Social History   Tobacco Use  . Smoking status: Former Smoker    Packs/day: 0.75    Years: 47.00    Pack years: 35.25    Types: Cigarettes    Last attempt to quit: 01/08/2014    Years since quitting: 4.6  . Smokeless tobacco: Never Used  . Tobacco comment: Smoked 1/2-1 ppd since age 9 Quit May 2015  Substance Use Topics  . Alcohol use: No      Objective:   BP 128/68 (BP Location: Right Arm, Patient Position: Sitting, Cuff Size: Normal)   Pulse 60   Temp 97.8 F (36.6 C) (Oral)   Resp 16   Ht 5\' 6"  (1.676 m)   Wt 265 lb (120.2 kg)   BMI 42.77 kg/m    Physical Exam   General Appearance:    Alert, cooperative, no distress  Eyes:    PERRL, conjunctiva/corneas clear, EOM's intact       Lungs:     Clear to auscultation bilaterally, respirations unlabored  Heart:    Regular rate and rhythm  Neurologic:   Awake,  alert, oriented x 3. No apparent focal neurological           defect.       Results for orders placed or performed in visit on 08/20/18  POCT glycosylated hemoglobin (Hb A1C)  Result Value Ref Range   Hemoglobin A1C 6.7 (A) 4.0 - 5.6 %       Assessment & Plan    1. Essential hypertension Well controlled.  Continue current medications.   - TSH  2. Type 2 diabetes mellitus without complication, without long-term current use of insulin (HCC) Well controlled.  Continue current medications.   - POCT glycosylated hemoglobin (Hb A1C) - TSH  3. Hyperlipidemia, unspecified hyperlipidemia type She is tolerating simvastatin well with no adverse effects.   - Comprehensive metabolic panel - Lipid panel  4. Chronic kidney disease (CKD), active medical management without dialysis, stage 3 (moderate) (HCC)  - VITAMIN  D 25 Hydroxy (Vit-D Deficiency, Fractures)  5. Anosmia She is not interested in ENT evaluation as she states she can live with it. Check basic labs. No further work up at this time if normal.  - Vitamin B12 - TSH - Zinc  6. Numbness and tingling of lower extremity  - Vitamin B12     Lelon Huh, MD  [facility]

## 2018-08-22 LAB — VITAMIN D 25 HYDROXY (VIT D DEFICIENCY, FRACTURES): VIT D 25 HYDROXY: 32.8 ng/mL (ref 30.0–100.0)

## 2018-08-22 LAB — COMPREHENSIVE METABOLIC PANEL
ALK PHOS: 88 IU/L (ref 39–117)
ALT: 10 IU/L (ref 0–32)
AST: 19 IU/L (ref 0–40)
Albumin/Globulin Ratio: 1.5 (ref 1.2–2.2)
Albumin: 4.1 g/dL (ref 3.5–4.8)
BILIRUBIN TOTAL: 0.5 mg/dL (ref 0.0–1.2)
BUN/Creatinine Ratio: 10 — ABNORMAL LOW (ref 12–28)
BUN: 16 mg/dL (ref 8–27)
CALCIUM: 9.8 mg/dL (ref 8.7–10.3)
CHLORIDE: 106 mmol/L (ref 96–106)
CO2: 25 mmol/L (ref 20–29)
Creatinine, Ser: 1.58 mg/dL — ABNORMAL HIGH (ref 0.57–1.00)
GFR calc Af Amer: 36 mL/min/{1.73_m2} — ABNORMAL LOW (ref >59)
GFR, EST NON AFRICAN AMERICAN: 31 mL/min/{1.73_m2} — AB (ref >59)
Globulin, Total: 2.7 g/dL (ref 1.5–4.5)
Glucose: 126 mg/dL — ABNORMAL HIGH (ref 65–99)
POTASSIUM: 4.6 mmol/L (ref 3.5–5.2)
Sodium: 146 mmol/L — ABNORMAL HIGH (ref 134–144)
Total Protein: 6.8 g/dL (ref 6.0–8.5)

## 2018-08-22 LAB — LIPID PANEL
CHOLESTEROL TOTAL: 165 mg/dL (ref 100–199)
Chol/HDL Ratio: 3 ratio (ref 0.0–4.4)
HDL: 55 mg/dL (ref >39)
LDL Calculated: 80 mg/dL (ref 0–99)
TRIGLYCERIDES: 152 mg/dL — AB (ref 0–149)
VLDL Cholesterol Cal: 30 mg/dL (ref 5–40)

## 2018-08-22 LAB — ZINC: Zinc: 81 ug/dL (ref 56–134)

## 2018-08-22 LAB — VITAMIN B12: Vitamin B-12: 858 pg/mL (ref 232–1245)

## 2018-08-22 LAB — TSH: TSH: 1.08 u[IU]/mL (ref 0.450–4.500)

## 2018-08-23 ENCOUNTER — Telehealth: Payer: Self-pay

## 2018-08-23 NOTE — Telephone Encounter (Signed)
Pt advised.  She report her loss of smell is not bothersome at this time.    Thanks,   -Mickel Baas

## 2018-08-23 NOTE — Telephone Encounter (Signed)
-----   Message from Birdie Sons, MD sent at 08/22/2018  5:39 PM EST ----- Labs all good. No abnormalities that would explain loss of smell. If it bothers her we can refer to ENT. If it doesn't bother her then she doesn't have to see ENT

## 2018-08-30 ENCOUNTER — Ambulatory Visit
Admission: RE | Admit: 2018-08-30 | Discharge: 2018-08-30 | Disposition: A | Discharge status: Home / Self Care | Payer: Medicare Other | Source: Ambulatory Visit | Attending: Family Medicine | Admitting: Family Medicine

## 2018-08-30 DIAGNOSIS — Z1231 Encounter for screening mammogram for malignant neoplasm of breast: Secondary | ICD-10-CM

## 2018-11-10 DIAGNOSIS — I10 Essential (primary) hypertension: Secondary | ICD-10-CM | POA: Diagnosis not present

## 2018-11-10 DIAGNOSIS — H9311 Tinnitus, right ear: Secondary | ICD-10-CM | POA: Diagnosis not present

## 2018-11-10 DIAGNOSIS — Z79899 Other long term (current) drug therapy: Secondary | ICD-10-CM | POA: Diagnosis not present

## 2018-11-10 DIAGNOSIS — E7800 Pure hypercholesterolemia, unspecified: Secondary | ICD-10-CM | POA: Diagnosis not present

## 2018-11-10 DIAGNOSIS — E119 Type 2 diabetes mellitus without complications: Secondary | ICD-10-CM | POA: Diagnosis not present

## 2018-11-10 DIAGNOSIS — H9191 Unspecified hearing loss, right ear: Secondary | ICD-10-CM | POA: Diagnosis not present

## 2018-11-10 DIAGNOSIS — M961 Postlaminectomy syndrome, not elsewhere classified: Secondary | ICD-10-CM | POA: Diagnosis not present

## 2018-12-14 ENCOUNTER — Ambulatory Visit: Payer: Medicare Other | Admitting: Family Medicine

## 2018-12-27 DIAGNOSIS — R300 Dysuria: Secondary | ICD-10-CM | POA: Diagnosis not present

## 2018-12-27 DIAGNOSIS — N3001 Acute cystitis with hematuria: Secondary | ICD-10-CM | POA: Diagnosis not present

## 2019-02-02 ENCOUNTER — Telehealth: Payer: Self-pay | Admitting: Family Medicine

## 2019-02-02 DIAGNOSIS — E119 Type 2 diabetes mellitus without complications: Secondary | ICD-10-CM | POA: Diagnosis not present

## 2019-02-02 DIAGNOSIS — E7800 Pure hypercholesterolemia, unspecified: Secondary | ICD-10-CM | POA: Diagnosis not present

## 2019-02-02 DIAGNOSIS — Z79899 Other long term (current) drug therapy: Secondary | ICD-10-CM | POA: Diagnosis not present

## 2019-02-02 NOTE — Chronic Care Management (AMB) (Signed)
°  Chronic Care Management   Outreach Note  02/02/2019 Name: Suzanne Faulkner MRN: [id number] DOB: [birth year]  Referred by: Birdie Sons, MD Reason for referral : Chronic Care Management (Initial CCM outreach was unsuccessful.)   An unsuccessful telephone outreach was attempted today. The patient was referred to the case management team by for assistance with chronic care management and care coordination.   Follow Up Plan: A HIPPA compliant phone message was left for the patient providing contact information and requesting a return call.  The care management team will reach out to the patient again over the next 7 days.  If patient returns call to provider office, please advise to call [place] at [place]  [email or web address]   ??[phone]

## 2019-02-08 DIAGNOSIS — R438 Other disturbances of smell and taste: Secondary | ICD-10-CM | POA: Diagnosis not present

## 2019-02-08 DIAGNOSIS — H903 Sensorineural hearing loss, bilateral: Secondary | ICD-10-CM | POA: Diagnosis not present

## 2019-02-08 DIAGNOSIS — H9313 Tinnitus, bilateral: Secondary | ICD-10-CM | POA: Diagnosis not present

## 2019-02-09 DIAGNOSIS — Z79899 Other long term (current) drug therapy: Secondary | ICD-10-CM | POA: Diagnosis not present

## 2019-02-09 DIAGNOSIS — E1165 Type 2 diabetes mellitus with hyperglycemia: Secondary | ICD-10-CM | POA: Diagnosis not present

## 2019-02-09 DIAGNOSIS — I10 Essential (primary) hypertension: Secondary | ICD-10-CM | POA: Diagnosis not present

## 2019-02-09 DIAGNOSIS — N183 Chronic kidney disease, stage 3 (moderate): Secondary | ICD-10-CM | POA: Diagnosis not present

## 2019-02-09 DIAGNOSIS — E7800 Pure hypercholesterolemia, unspecified: Secondary | ICD-10-CM | POA: Diagnosis not present

## 2019-02-09 NOTE — Chronic Care Management (AMB) (Signed)
°  Chronic Care Management   Outreach Note  02/09/2019 Name: Suzanne Faulkner MRN: [id number] DOB: June 17, 1941  Referred by: Birdie Sons, MD Reason for referral : Chronic Care Management (Initial CCM outreach was unsuccessful.) and Chronic Care Management (Second CCM outreach was unsuccessful.)   A second unsuccessful telephone outreach was attempted today. The patient was referred to the case management team for assistance with chronic care management and care coordination.   Follow Up Plan: A HIPPA compliant phone message was left for the patient providing contact information and requesting a return call.  The care management team will reach out to the patient again over the next 7 days.  If patient returns call to provider office, please advise to call [place] at [place]  [email or web address]   ??[phone]

## 2019-02-18 NOTE — Chronic Care Management (AMB) (Signed)
Chronic Care Management   Note  02/18/2019 Name: Burkley Dech MRN: [id number] DOB: November 05, 1940  Suzanne Faulkner is a 78 y.o. year old female who is a primary care patient of Fisher, Kirstie Peri, MD. I reached out to Freddi Starr by phone today in response to a referral sent by Ms. Alden Benjamin [place]'s health plan.    Ms. Deluna was given information about Chronic Care Management services today including:  1. CCM service includes personalized support from designated clinical staff supervised by her physician, including individualized plan of care and coordination with other care providers 2. 24/7 contact phone numbers for assistance for urgent and routine care needs. 3. Service will only be billed when office clinical staff spend 20 minutes or more in a month to coordinate care. 4. Only one practitioner may furnish and bill the service in a calendar month. 5. The patient may stop CCM services at any time (effective at the end of the month) by phone call to the office staff. 6. The patient will be responsible for cost sharing (co-pay) of up to 20% of the service fee (after annual deductible is met).  Patient did not agree to enrollment in care management services and does not wish to consider at this time. Because she has relocated to [place].   Follow up plan: No follow up needed.   [place]  [email or web address]   ??[phone]

## 2019-03-31 DIAGNOSIS — N183 Chronic kidney disease, stage 3 (moderate): Secondary | ICD-10-CM | POA: Diagnosis not present

## 2019-03-31 DIAGNOSIS — N179 Acute kidney failure, unspecified: Secondary | ICD-10-CM | POA: Diagnosis not present

## 2019-03-31 DIAGNOSIS — I10 Essential (primary) hypertension: Secondary | ICD-10-CM | POA: Diagnosis not present

## 2019-04-05 ENCOUNTER — Other Ambulatory Visit: Payer: Self-pay | Admitting: Family Medicine

## 2019-04-06 DIAGNOSIS — N179 Acute kidney failure, unspecified: Secondary | ICD-10-CM | POA: Diagnosis not present

## 2019-04-06 DIAGNOSIS — N281 Cyst of kidney, acquired: Secondary | ICD-10-CM | POA: Diagnosis not present

## 2019-04-12 DIAGNOSIS — R269 Unspecified abnormalities of gait and mobility: Secondary | ICD-10-CM | POA: Diagnosis not present

## 2019-04-12 DIAGNOSIS — E7800 Pure hypercholesterolemia, unspecified: Secondary | ICD-10-CM | POA: Diagnosis not present

## 2019-04-12 DIAGNOSIS — N183 Chronic kidney disease, stage 3 (moderate): Secondary | ICD-10-CM | POA: Diagnosis not present

## 2019-04-12 DIAGNOSIS — Z79899 Other long term (current) drug therapy: Secondary | ICD-10-CM | POA: Diagnosis not present

## 2019-04-12 DIAGNOSIS — R439 Unspecified disturbances of smell and taste: Secondary | ICD-10-CM | POA: Diagnosis not present

## 2019-04-12 DIAGNOSIS — M961 Postlaminectomy syndrome, not elsewhere classified: Secondary | ICD-10-CM | POA: Diagnosis not present

## 2019-04-12 DIAGNOSIS — E1165 Type 2 diabetes mellitus with hyperglycemia: Secondary | ICD-10-CM | POA: Diagnosis not present

## 2019-04-12 DIAGNOSIS — I10 Essential (primary) hypertension: Secondary | ICD-10-CM | POA: Diagnosis not present

## 2019-04-20 DIAGNOSIS — R269 Unspecified abnormalities of gait and mobility: Secondary | ICD-10-CM | POA: Diagnosis not present

## 2019-04-20 DIAGNOSIS — M545 Low back pain: Secondary | ICD-10-CM | POA: Diagnosis not present

## 2019-04-21 ENCOUNTER — Ambulatory Visit: Payer: Medicare Other

## 2019-04-21 ENCOUNTER — Encounter: Payer: Medicare Other | Admitting: Family Medicine

## 2019-04-21 DIAGNOSIS — R269 Unspecified abnormalities of gait and mobility: Secondary | ICD-10-CM | POA: Diagnosis not present

## 2019-04-21 DIAGNOSIS — M545 Low back pain: Secondary | ICD-10-CM | POA: Diagnosis not present

## 2019-04-25 DIAGNOSIS — R269 Unspecified abnormalities of gait and mobility: Secondary | ICD-10-CM | POA: Diagnosis not present

## 2019-04-25 DIAGNOSIS — M545 Low back pain: Secondary | ICD-10-CM | POA: Diagnosis not present

## 2019-04-27 DIAGNOSIS — Z79899 Other long term (current) drug therapy: Secondary | ICD-10-CM | POA: Diagnosis not present

## 2019-04-27 DIAGNOSIS — E7800 Pure hypercholesterolemia, unspecified: Secondary | ICD-10-CM | POA: Diagnosis not present

## 2019-04-27 DIAGNOSIS — J3489 Other specified disorders of nose and nasal sinuses: Secondary | ICD-10-CM | POA: Diagnosis not present

## 2019-04-27 DIAGNOSIS — E1165 Type 2 diabetes mellitus with hyperglycemia: Secondary | ICD-10-CM | POA: Diagnosis not present

## 2019-04-27 DIAGNOSIS — I10 Essential (primary) hypertension: Secondary | ICD-10-CM | POA: Diagnosis not present

## 2019-04-27 DIAGNOSIS — R269 Unspecified abnormalities of gait and mobility: Secondary | ICD-10-CM | POA: Diagnosis not present

## 2019-05-02 DIAGNOSIS — R269 Unspecified abnormalities of gait and mobility: Secondary | ICD-10-CM | POA: Diagnosis not present

## 2019-05-02 DIAGNOSIS — M545 Low back pain: Secondary | ICD-10-CM | POA: Diagnosis not present

## 2019-05-04 DIAGNOSIS — M545 Low back pain: Secondary | ICD-10-CM | POA: Diagnosis not present

## 2019-05-04 DIAGNOSIS — R269 Unspecified abnormalities of gait and mobility: Secondary | ICD-10-CM | POA: Diagnosis not present

## 2019-05-09 DIAGNOSIS — M269 Dentofacial anomaly, unspecified: Secondary | ICD-10-CM | POA: Diagnosis not present

## 2019-05-09 DIAGNOSIS — M545 Low back pain: Secondary | ICD-10-CM | POA: Diagnosis not present

## 2019-05-11 DIAGNOSIS — R269 Unspecified abnormalities of gait and mobility: Secondary | ICD-10-CM | POA: Diagnosis not present

## 2019-05-11 DIAGNOSIS — M545 Low back pain: Secondary | ICD-10-CM | POA: Diagnosis not present

## 2019-05-16 DIAGNOSIS — M269 Dentofacial anomaly, unspecified: Secondary | ICD-10-CM | POA: Diagnosis not present

## 2019-05-16 DIAGNOSIS — M545 Low back pain: Secondary | ICD-10-CM | POA: Diagnosis not present

## 2019-05-18 DIAGNOSIS — M545 Low back pain: Secondary | ICD-10-CM | POA: Diagnosis not present

## 2019-05-18 DIAGNOSIS — R269 Unspecified abnormalities of gait and mobility: Secondary | ICD-10-CM | POA: Diagnosis not present

## 2019-05-23 DIAGNOSIS — M269 Dentofacial anomaly, unspecified: Secondary | ICD-10-CM | POA: Diagnosis not present

## 2019-05-23 DIAGNOSIS — J3489 Other specified disorders of nose and nasal sinuses: Secondary | ICD-10-CM | POA: Diagnosis not present

## 2019-05-23 DIAGNOSIS — R438 Other disturbances of smell and taste: Secondary | ICD-10-CM | POA: Diagnosis not present

## 2019-05-23 DIAGNOSIS — H9313 Tinnitus, bilateral: Secondary | ICD-10-CM | POA: Diagnosis not present

## 2019-05-23 DIAGNOSIS — M545 Low back pain: Secondary | ICD-10-CM | POA: Diagnosis not present

## 2019-05-25 DIAGNOSIS — M269 Dentofacial anomaly, unspecified: Secondary | ICD-10-CM | POA: Diagnosis not present

## 2019-05-25 DIAGNOSIS — M545 Low back pain: Secondary | ICD-10-CM | POA: Diagnosis not present

## 2019-05-27 DIAGNOSIS — E119 Type 2 diabetes mellitus without complications: Secondary | ICD-10-CM | POA: Diagnosis not present

## 2019-05-27 DIAGNOSIS — E785 Hyperlipidemia, unspecified: Secondary | ICD-10-CM | POA: Diagnosis not present

## 2019-05-27 DIAGNOSIS — I10 Essential (primary) hypertension: Secondary | ICD-10-CM | POA: Diagnosis not present

## 2019-05-27 DIAGNOSIS — R9431 Abnormal electrocardiogram [ECG] [EKG]: Secondary | ICD-10-CM | POA: Diagnosis not present

## 2019-05-27 DIAGNOSIS — Z23 Encounter for immunization: Secondary | ICD-10-CM | POA: Diagnosis not present

## 2019-06-28 DIAGNOSIS — E119 Type 2 diabetes mellitus without complications: Secondary | ICD-10-CM | POA: Diagnosis not present

## 2019-06-28 DIAGNOSIS — I10 Essential (primary) hypertension: Secondary | ICD-10-CM | POA: Diagnosis not present

## 2019-06-28 DIAGNOSIS — K5900 Constipation, unspecified: Secondary | ICD-10-CM | POA: Diagnosis not present

## 2019-06-28 DIAGNOSIS — E785 Hyperlipidemia, unspecified: Secondary | ICD-10-CM | POA: Diagnosis not present

## 2019-08-24 ENCOUNTER — Other Ambulatory Visit: Payer: Self-pay | Admitting: Family Medicine

## 2019-08-24 DIAGNOSIS — I10 Essential (primary) hypertension: Secondary | ICD-10-CM

## 2019-08-24 NOTE — Telephone Encounter (Signed)
Requested medication (s) are due for refill today: yes  Requested medication (s) are on the active medication list: yes  Last refill:  05/20/2019  Future visit scheduled: no  Notes to clinic:  overdue for labs and follow up  Review for refill   Requested Prescriptions  Pending Prescriptions Disp Refills   hydrochlorothiazide (MICROZIDE) 12.5 MG capsule [Pharmacy Med Name: hydroCHLOROthiazide 12.5 MG Oral Capsule] 90 capsule 0    Sig: TAKE 1 CAPSULE BY MOUTH ONCE DAILY      Cardiovascular: Diuretics - Thiazide Failed - 08/24/2019  1:09 PM      Failed - Ca in normal range and within 360 days    Calcium  Date Value Ref Range Status  08/20/2018 9.8 8.7 - 10.3 mg/dL Final   Calcium, Ion  Date Value Ref Range Status  01/25/2013 1.25 1.13 - 1.30 mmol/L Final          Failed - Cr in normal range and within 360 days    Creat  Date Value Ref Range Status  04/23/2017 1.24 (H) 0.60 - 0.93 mg/dL Final    Comment:    For patients >56 years of age, the reference limit for Creatinine is approximately 13% higher for people identified as African-American. .    Creatinine, Ser  Date Value Ref Range Status  08/20/2018 1.58 (H) 0.57 - 1.00 mg/dL Final   Creatinine, Urine  Date Value Ref Range Status  01/25/2013 167.80 mg/dL Final          Failed - K in normal range and within 360 days    Potassium  Date Value Ref Range Status  08/20/2018 4.6 3.5 - 5.2 mmol/L Final          Failed - Na in normal range and within 360 days    Sodium  Date Value Ref Range Status  08/20/2018 146 (H) 134 - 144 mmol/L Final          Failed - Valid encounter within last 6 months    Recent Outpatient Visits           1 year ago Essential hypertension   [facility] Birdie Sons, MD   1 year ago Essential hypertension   [facility] Birdie Sons, MD   1 year ago Type 2 diabetes mellitus without complication, without long-term current use of insulin [facility])   [facility] Birdie Sons, MD   1 year ago Thumb pain, left   [facility] Birdie Sons, MD   2 years ago Type 2 diabetes mellitus without complication, without long-term current use of insulin [facility])   [facility] Birdie Sons, MD              Passed - Last BP in normal range    BP Readings from Last 1 Encounters:  08/20/18 128/68

## 2019-12-02 ENCOUNTER — Other Ambulatory Visit: Payer: Self-pay | Admitting: Family Medicine

## 2019-12-02 DIAGNOSIS — I10 Essential (primary) hypertension: Secondary | ICD-10-CM

## 2019-12-02 NOTE — Telephone Encounter (Signed)
Courtesy refill Patient needs appointment

## 2020-06-16 ENCOUNTER — Other Ambulatory Visit: Payer: Self-pay | Admitting: Family Medicine

## 2021-06-24 ENCOUNTER — Other Ambulatory Visit: Payer: Self-pay | Admitting: Family Medicine

## 2021-09-27 ENCOUNTER — Other Ambulatory Visit: Payer: Self-pay | Admitting: Family Medicine

## 2022-12-14 ENCOUNTER — Other Ambulatory Visit: Payer: Self-pay | Admitting: Family Medicine

## 2023-01-06 ENCOUNTER — Telehealth: Payer: Self-pay | Admitting: Family Medicine

## 2023-01-06 NOTE — Telephone Encounter (Signed)
Patient reports that she has established care at [facility], [place].

## 2023-01-06 NOTE — Telephone Encounter (Signed)
[facility] pharmacy faxed refill request for the following medications:   simvastatin (ZOCOR) 20 MG tablet    Please advise

## 2023-01-09 ENCOUNTER — Other Ambulatory Visit: Payer: Self-pay | Admitting: Family Medicine

## 2023-02-11 ENCOUNTER — Other Ambulatory Visit: Payer: Self-pay | Admitting: Family Medicine

## 2023-03-19 ENCOUNTER — Other Ambulatory Visit: Payer: Self-pay | Admitting: Family Medicine

## 2023-03-20 NOTE — Telephone Encounter (Signed)
Requested medications are due for refill today.  yes  Requested medications are on the active medications list.  yes  Last refill. 12/14/2022 #30 0 rf  Future visit scheduled.   no  Notes to clinic.  No pcp listed. Courtesy refill already given.    Requested Prescriptions  Pending Prescriptions Disp Refills   simvastatin (ZOCOR) 20 MG tablet [Pharmacy Med Name: Simvastatin 20 MG Oral Tablet] 30 tablet 0    Sig: TAKE 1 TABLET BY MOUTH AT BEDTIME     Cardiovascular:  Antilipid - Statins Failed - 03/19/2023 12:04 PM      Failed - Valid encounter within last 12 months    Recent Outpatient Visits           4 years ago Essential hypertension   [place] [facility] Malva Limes, MD   4 years ago Essential hypertension   [place] [facility] Malva Limes, MD   5 years ago Type 2 diabetes mellitus without complication, without long-term current use of insulin (HCC)   [place] [facility] Malva Limes, MD   5 years ago Thumb pain, left   [place] [facility] Malva Limes, MD   5 years ago Type 2 diabetes mellitus without complication, without long-term current use of insulin (HCC)   [place] [facility] Malva Limes, MD              Failed - Lipid Panel in normal range within the last 12 months    Cholesterol, Total  Date Value Ref Range Status  08/20/2018 165 100 - 199 mg/dL Final   LDL Cholesterol (Calc)  Date Value Ref Range Status  04/23/2017 95 mg/dL (calc) Final    Comment:    Reference range: <100 . Desirable range <100 mg/dL for primary prevention;   <70 mg/dL for patients with CHD or diabetic patients  with > or = 2 CHD risk factors. Marland Kitchen LDL-C is now calculated using the Martin-Hopkins  calculation, which is a validated novel method providing  better accuracy than the Friedewald equation in the  estimation of LDL-C.  Horald Pollen et al. Lenox Ahr. 9147;829(56):  2061-2068  (http://education.[facility].com/faq/FAQ164)    LDL Calculated  Date Value Ref Range Status  08/20/2018 80 0 - 99 mg/dL Final   HDL  Date Value Ref Range Status  08/20/2018 55 >39 mg/dL Final   Triglycerides  Date Value Ref Range Status  08/20/2018 152 (H) 0 - 149 mg/dL Final         Passed - Patient is not pregnant
